# Patient Record
Sex: Female | Born: 1935 | Race: White | Hispanic: No | Smoking: Never smoker
Health system: Southern US, Community
[De-identification: ages and names within clinical notes are randomized; demographics above are authoritative.]

## PROBLEM LIST (undated history)

## (undated) DIAGNOSIS — E039 Hypothyroidism, unspecified: Secondary | ICD-10-CM

## (undated) DIAGNOSIS — F0390 Unspecified dementia without behavioral disturbance: Secondary | ICD-10-CM

## (undated) DIAGNOSIS — I10 Essential (primary) hypertension: Secondary | ICD-10-CM

---

## 2018-11-27 ENCOUNTER — Emergency Department (HOSPITAL_COMMUNITY)
Admission: EM | Admit: 2018-11-27 | Discharge: 2018-11-28 | Disposition: A | Discharge status: Home / Self Care | Payer: Medicare Other | Attending: Emergency Medicine | Admitting: Emergency Medicine

## 2018-11-27 DIAGNOSIS — M545 Low back pain, unspecified: Secondary | ICD-10-CM

## 2018-11-27 DIAGNOSIS — K5900 Constipation, unspecified: Secondary | ICD-10-CM | POA: Diagnosis not present

## 2018-11-27 DIAGNOSIS — R109 Unspecified abdominal pain: Secondary | ICD-10-CM | POA: Diagnosis present

## 2018-11-27 LAB — COMPREHENSIVE METABOLIC PANEL
ALK PHOS: 71 U/L (ref 38–126)
ALT: 17 U/L (ref 0–44)
AST: 19 U/L (ref 15–41)
Albumin: 3.5 g/dL (ref 3.5–5.0)
Anion gap: 6 (ref 5–15)
BUN: 19 mg/dL (ref 8–23)
CALCIUM: 10.1 mg/dL (ref 8.9–10.3)
CO2: 27 mmol/L (ref 22–32)
Chloride: 107 mmol/L (ref 98–111)
Creatinine, Ser: 0.77 mg/dL (ref 0.44–1.00)
GFR calc Af Amer: >60 mL/min (ref >60)
GFR calc non Af Amer: >60 mL/min (ref >60)
Glucose, Bld: 156 mg/dL — ABNORMAL HIGH (ref 70–99)
Potassium: 3.5 mmol/L (ref 3.5–5.1)
Sodium: 140 mmol/L (ref 135–145)
Total Bilirubin: 0.5 mg/dL (ref 0.3–1.2)
Total Protein: 5.5 g/dL — ABNORMAL LOW (ref 6.5–8.1)

## 2018-11-27 LAB — URINALYSIS, ROUTINE W REFLEX MICROSCOPIC
Bilirubin Urine: NEGATIVE
Glucose, UA: NEGATIVE mg/dL
HGB URINE DIPSTICK: NEGATIVE
Ketones, ur: NEGATIVE mg/dL
Leukocytes,Ua: NEGATIVE
Nitrite: NEGATIVE
Protein, ur: NEGATIVE mg/dL
Specific Gravity, Urine: 1.012 (ref 1.005–1.030)
pH: 5 (ref 5.0–8.0)

## 2018-11-27 LAB — CBC
HCT: 39.2 % (ref 36.0–46.0)
Hemoglobin: 12.8 g/dL (ref 12.0–15.0)
MCH: 31.8 pg (ref 26.0–34.0)
MCHC: 32.7 g/dL (ref 30.0–36.0)
MCV: 97.5 fL (ref 80.0–100.0)
Platelets: 263 10*3/uL (ref 150–400)
RBC: 4.02 MIL/uL (ref 3.87–5.11)
RDW: 12.7 % (ref 11.5–15.5)
WBC: 8.1 10*3/uL (ref 4.0–10.5)
nRBC: 0 % (ref 0.0–0.2)

## 2018-11-27 MED ORDER — SODIUM CHLORIDE 0.9% FLUSH: 3.0000 mL | Freq: Once | INTRAVENOUS | Status: DC

## 2018-11-27 NOTE — ED Triage Notes (Signed)
Per family pt fell last week, was seen at ED in [place] and had xrays done which were all negative. Was told by doctors she needs to have MRI done if pain persists, but unsure of what kind of MRI needs to be done. Family states pt has generalized pain and is more confused than usual.

## 2018-11-28 ENCOUNTER — Emergency Department (HOSPITAL_COMMUNITY): Payer: Medicare Other

## 2018-11-28 DIAGNOSIS — M545 Low back pain: Secondary | ICD-10-CM | POA: Diagnosis not present

## 2018-11-28 NOTE — Discharge Instructions (Addendum)
You can be discharged home safely tonight. It is recommended that you use Miralax once daily for the next 3 days. Follow up with your doctor for recheck next week.

## 2018-11-28 NOTE — ED Notes (Signed)
ED Provider at bedside. 

## 2018-11-28 NOTE — ED Provider Notes (Signed)
[facility] EMERGENCY DEPARTMENT Provider Note   CSN: [id number] Arrival date & time: 11/27/18  1959     History   Chief Complaint Chief Complaint  Patient presents with  . Fall    HPI Adie Hoecker is a 85 y.o. female.  85 yo patient presents with daughter, who is caregiver, for further evaluation of right sided abdominal pain and low back pain since a fall 8 days ago. The patient normally walks with a walker. She got up to go to the bathroom and fell while walking. She was able to get herself up to a chair, went back to bed and was able to get up and down to the bathroom through the night as per her usual nocturia. The next morning she called for her daughter, whom she lives with, and reported low back and RLQ abdominal pain, presumably because of the fall. She denies having hit her head, denies chest pain, headache, SOB, nausea or vomiting. She has continued to eat and drink per her usual. She has chronic issues with constipation and takes medication regularly for this. Last BM was yesterday. No urinary incontinence. Her daughter took her to the hospital in [place] who did an x-ray, discharged home home and mentioned that if the back pain doesn't get better she may need an MRI, which the patient is asking about tonight.  The history is provided by the patient and a caregiver. No language interpreter was used.  Fall  Associated symptoms include abdominal pain.    No past medical history on file.  There are no active problems to display for this patient.    OB History   No obstetric history on file.      Home Medications    Prior to Admission medications   Not on File    Family History No family history on file.  Social History Social History   Tobacco Use  . Smoking status: Not on file  Substance Use Topics  . Alcohol use: Not on file  . Drug use: Not on file     Allergies   Azithromycin   Review of Systems Review of Systems    Constitutional: Negative for chills and fever.  HENT: Negative.   Respiratory: Negative.   Cardiovascular: Negative.   Gastrointestinal: Positive for abdominal pain.  Musculoskeletal: Positive for back pain.  Skin: Negative.   Neurological: Negative.      Physical Exam Updated Vital Signs BP (!) 189/86   Pulse 79   Temp 98.4 F (36.9 C) (Oral)   Resp 16   SpO2 95%   Physical Exam Constitutional:      General: She is not in acute distress.    Appearance: She is well-developed. She is not ill-appearing.  HENT:     Head: Normocephalic and atraumatic.     Mouth/Throat:     Mouth: Mucous membranes are moist.  Neck:     Musculoskeletal: Normal range of motion and neck supple.  Cardiovascular:     Rate and Rhythm: Normal rate and regular rhythm.     Heart sounds: No murmur.  Pulmonary:     Effort: Pulmonary effort is normal.     Breath sounds: Normal breath sounds. No wheezing, rhonchi or rales.  Abdominal:     General: Bowel sounds are normal.     Palpations: Abdomen is soft.     Tenderness: There is abdominal tenderness (Mild right abdominal tenderness. There is fullness in the RLQ possibly representing constipation.). There is no guarding  or rebound.  Musculoskeletal: Normal range of motion.        General: No tenderness.     Right lower leg: No edema.     Left lower leg: No edema.     Comments: Back appears atraumatic. She is tender to midline lower back. FROM with full movement in LE's.   Skin:    General: Skin is warm and dry.     Findings: No rash.  Neurological:     Mental Status: She is alert and oriented to person, place, and time.     Sensory: No sensory deficit.     Coordination: Coordination normal.      ED Treatments / Results  Labs (all labs ordered are listed, but only abnormal results are displayed) Labs Reviewed  COMPREHENSIVE METABOLIC PANEL - Abnormal; Notable for the following components:      Result Value   Glucose, Bld 156 (*)    Total  Protein 5.5 (*)    All other components within normal limits  URINE CULTURE  CBC  URINALYSIS, ROUTINE W REFLEX MICROSCOPIC    EKG None  Radiology No results found.  Procedures Procedures (including critical care time)  Medications Ordered in ED Medications  sodium chloride flush (NS) 0.9 % injection 3 mL (has no administration in time range)     Initial Impression / Assessment and Plan / ED Course  I have reviewed the triage vital signs and the nursing notes.  Pertinent labs & imaging results that were available during my care of the patient were reviewed by me and considered in my medical decision making (see chart for details).     Patient to ED with complaint of RLQ abdominal and lower back pain since fall over one week ago. Seen in the interim with concern for ?back injury.  The patient is very well appearing. Oriented. Moves all extremities. Is in NAD. VSS, hypertensive (hasn't taken night time meds). Abdomen is soft, mildly tender. Fullness in RLQ with potential for constipation. Do not feel there is a solid mass.   She is evaluated by Dr. Nicanor Alcon who agrees with exam.   Imaging shows moderate stool burden. Normal BGP. Do no suspect obstruction. Lumbar spine shows some curvature but no acute change.   Results shared with patient and daughter. She is appropriate for discharge home and it is recommended she use Miralax once daily for the next 3 days. Follow up with PCP also recommended.   Final Clinical Impressions(s) / ED Diagnoses   Final diagnoses:  None   1. Constipation 2. Low back pain  ED Discharge Orders    None       Danne Harbor 11/28/18 2707    Palumbo, April, MD 11/28/18 0300

## 2018-11-29 LAB — URINE CULTURE: Culture: NO GROWTH

## 2019-04-05 ENCOUNTER — Inpatient Hospital Stay (HOSPITAL_COMMUNITY)
Admission: AD | Admit: 2019-04-05 | Discharge: 2019-04-07 | DRG: 378 | Disposition: A | Discharge status: Home / Self Care | Payer: Medicare Other | Source: Other Acute Inpatient Hospital | Attending: Internal Medicine | Admitting: Internal Medicine

## 2019-04-05 ENCOUNTER — Encounter (HOSPITAL_COMMUNITY): Payer: Self-pay | Admitting: Internal Medicine

## 2019-04-05 DIAGNOSIS — E039 Hypothyroidism, unspecified: Secondary | ICD-10-CM | POA: Diagnosis present

## 2019-04-05 DIAGNOSIS — Z7982 Long term (current) use of aspirin: Secondary | ICD-10-CM | POA: Diagnosis not present

## 2019-04-05 DIAGNOSIS — Z79899 Other long term (current) drug therapy: Secondary | ICD-10-CM

## 2019-04-05 DIAGNOSIS — F0390 Unspecified dementia without behavioral disturbance: Secondary | ICD-10-CM | POA: Diagnosis present

## 2019-04-05 DIAGNOSIS — Z7952 Long term (current) use of systemic steroids: Secondary | ICD-10-CM

## 2019-04-05 DIAGNOSIS — D62 Acute posthemorrhagic anemia: Secondary | ICD-10-CM | POA: Diagnosis present

## 2019-04-05 DIAGNOSIS — Z881 Allergy status to other antibiotic agents status: Secondary | ICD-10-CM

## 2019-04-05 DIAGNOSIS — K922 Gastrointestinal hemorrhage, unspecified: Secondary | ICD-10-CM | POA: Diagnosis present

## 2019-04-05 DIAGNOSIS — Z1159 Encounter for screening for other viral diseases: Secondary | ICD-10-CM

## 2019-04-05 DIAGNOSIS — Z79890 Hormone replacement therapy (postmenopausal): Secondary | ICD-10-CM | POA: Diagnosis not present

## 2019-04-05 DIAGNOSIS — I10 Essential (primary) hypertension: Secondary | ICD-10-CM | POA: Diagnosis present

## 2019-04-05 DIAGNOSIS — K921 Melena: Secondary | ICD-10-CM | POA: Diagnosis present

## 2019-04-05 DIAGNOSIS — K5791 Diverticulosis of intestine, part unspecified, without perforation or abscess with bleeding: Principal | ICD-10-CM | POA: Diagnosis present

## 2019-04-05 HISTORY — DX: Unspecified dementia, unspecified severity, without behavioral disturbance, psychotic disturbance, mood disturbance, and anxiety: F03.90

## 2019-04-05 HISTORY — DX: Essential (primary) hypertension: I10

## 2019-04-05 HISTORY — DX: Hypothyroidism, unspecified: E03.9

## 2019-04-05 LAB — CBC
HCT: 41.2 % (ref 36.0–46.0)
HCT: 41.3 % (ref 36.0–46.0)
HCT: 40.7 % (ref 36.0–46.0)
Hemoglobin: 13.6 g/dL (ref 12.0–15.0)
Hemoglobin: 13.6 g/dL (ref 12.0–15.0)
Hemoglobin: 13.5 g/dL (ref 12.0–15.0)
MCH: 31.9 pg (ref 26.0–34.0)
MCH: 31.9 pg (ref 26.0–34.0)
MCH: 31.9 pg (ref 26.0–34.0)
MCHC: 33 g/dL (ref 30.0–36.0)
MCHC: 32.9 g/dL (ref 30.0–36.0)
MCHC: 33.2 g/dL (ref 30.0–36.0)
MCV: 96.7 fL (ref 80.0–100.0)
MCV: 96.9 fL (ref 80.0–100.0)
MCV: 96.2 fL (ref 80.0–100.0)
Platelets: 246 10*3/uL (ref 150–400)
Platelets: 255 10*3/uL (ref 150–400)
Platelets: 258 10*3/uL (ref 150–400)
RBC: 4.26 MIL/uL (ref 3.87–5.11)
RBC: 4.26 MIL/uL (ref 3.87–5.11)
RBC: 4.23 MIL/uL (ref 3.87–5.11)
RDW: 12.4 % (ref 11.5–15.5)
RDW: 12.4 % (ref 11.5–15.5)
RDW: 12.5 % (ref 11.5–15.5)
WBC: 9.7 10*3/uL (ref 4.0–10.5)
WBC: 8.3 10*3/uL (ref 4.0–10.5)
WBC: 9 10*3/uL (ref 4.0–10.5)
nRBC: 0 % (ref 0.0–0.2)
nRBC: 0 % (ref 0.0–0.2)
nRBC: 0 % (ref 0.0–0.2)

## 2019-04-05 LAB — COMPREHENSIVE METABOLIC PANEL
ALT: 18 U/L (ref 0–44)
AST: 15 U/L (ref 15–41)
Albumin: 3.3 g/dL — ABNORMAL LOW (ref 3.5–5.0)
Alkaline Phosphatase: 79 U/L (ref 38–126)
Anion gap: 11 (ref 5–15)
BUN: 20 mg/dL (ref 8–23)
CO2: 26 mmol/L (ref 22–32)
Calcium: 10.2 mg/dL (ref 8.9–10.3)
Chloride: 102 mmol/L (ref 98–111)
Creatinine, Ser: 0.84 mg/dL (ref 0.44–1.00)
GFR calc Af Amer: >60 mL/min (ref >60)
GFR calc non Af Amer: >60 mL/min (ref >60)
Glucose, Bld: 99 mg/dL (ref 70–99)
Potassium: 3.7 mmol/L (ref 3.5–5.1)
Sodium: 139 mmol/L (ref 135–145)
Total Bilirubin: 0.9 mg/dL (ref 0.3–1.2)
Total Protein: 5.4 g/dL — ABNORMAL LOW (ref 6.5–8.1)

## 2019-04-05 LAB — GLUCOSE, CAPILLARY: Glucose-Capillary: 114 mg/dL — ABNORMAL HIGH (ref 70–99)

## 2019-04-05 LAB — RENAL FUNCTION PANEL
Albumin: 3.3 g/dL — ABNORMAL LOW (ref 3.5–5.0)
Anion gap: 9 (ref 5–15)
BUN: 20 mg/dL (ref 8–23)
CO2: 27 mmol/L (ref 22–32)
Calcium: 10.1 mg/dL (ref 8.9–10.3)
Chloride: 107 mmol/L (ref 98–111)
Creatinine, Ser: 0.82 mg/dL (ref 0.44–1.00)
GFR calc Af Amer: >60 mL/min (ref >60)
GFR calc non Af Amer: >60 mL/min (ref >60)
Glucose, Bld: 116 mg/dL — ABNORMAL HIGH (ref 70–99)
Phosphorus: 2.9 mg/dL (ref 2.5–4.6)
Potassium: 3.8 mmol/L (ref 3.5–5.1)
Sodium: 143 mmol/L (ref 135–145)

## 2019-04-05 LAB — LACTIC ACID, PLASMA: Lactic Acid, Venous: 1.3 mmol/L (ref 0.5–1.9)

## 2019-04-05 LAB — ABO/RH: ABO/RH(D): O POS

## 2019-04-05 LAB — TYPE AND SCREEN
ABO/RH(D): O POS
Antibody Screen: NEGATIVE

## 2019-04-05 LAB — MAGNESIUM: Magnesium: 2.1 mg/dL (ref 1.7–2.4)

## 2019-04-05 LAB — PROTIME-INR
INR: 1 (ref 0.8–1.2)
Prothrombin Time: 12.8 seconds (ref 11.4–15.2)

## 2019-04-05 MED ORDER — LABETALOL HCL 5 MG/ML IV SOLN
10.0000 mg | INTRAVENOUS | Status: DC | PRN
  Administered 2019-04-07 (×2): 10 mg via INTRAVENOUS
  Filled 2019-04-05 (×3): qty 4

## 2019-04-05 MED ORDER — ONDANSETRON HCL 4 MG/2ML IJ SOLN: 4.0000 mg | Freq: Four times a day (QID) | INTRAMUSCULAR | Status: DC | PRN

## 2019-04-05 MED ORDER — ONDANSETRON HCL 4 MG PO TABS: 4.0000 mg | ORAL_TABLET | Freq: Four times a day (QID) | ORAL | Status: DC | PRN

## 2019-04-05 MED ORDER — HYDRALAZINE HCL 20 MG/ML IJ SOLN: 10.0000 mg | INTRAMUSCULAR | Status: DC | PRN

## 2019-04-05 MED ORDER — ACETAMINOPHEN 650 MG RE SUPP: 650.0000 mg | Freq: Four times a day (QID) | RECTAL | Status: DC | PRN

## 2019-04-05 MED ORDER — DEXTROSE-NACL 5-0.9 % IV SOLN
INTRAVENOUS | Status: AC
  Administered 2019-04-05: 20:00:00 via INTRAVENOUS

## 2019-04-05 MED ORDER — PANTOPRAZOLE SODIUM 40 MG IV SOLR
40.0000 mg | Freq: Two times a day (BID) | INTRAVENOUS | Status: DC
  Administered 2019-04-05: 40 mg via INTRAVENOUS
  Filled 2019-04-05: qty 40

## 2019-04-05 MED ORDER — LEVOTHYROXINE SODIUM 100 MCG/5ML IV SOLN
62.5000 ug | Freq: Every day | INTRAVENOUS | Status: DC
  Administered 2019-04-06 – 2019-04-07 (×2): 62.5 ug via INTRAVENOUS
  Filled 2019-04-05 (×2): qty 5

## 2019-04-05 MED ORDER — ACETAMINOPHEN 325 MG PO TABS: 650.0000 mg | ORAL_TABLET | Freq: Four times a day (QID) | ORAL | Status: DC | PRN

## 2019-04-05 MED ORDER — HYDROCORTISONE NA SUCCINATE PF 100 MG IJ SOLR
50.0000 mg | Freq: Once | INTRAMUSCULAR | Status: AC
  Administered 2019-04-05: 08:00:00 50 mg via INTRAVENOUS
  Filled 2019-04-05: qty 2

## 2019-04-05 NOTE — Consult Note (Signed)
Referring Provider:   Dr. Leonia CoronaS. Ogbata Primary Care Physician:  Patient, No Pcp Per Primary Gastroenterologist:  None (unassigned--previously colonosc[place] in [place])  Reason for Consultation:  hematochezia  HPI: Catherine Hess is a 85 y.o. female admitted to the hospital yesterday in transfer from [place], [place], where she resides, but where no GI coverage was available, because of a 1 day history of 5 episodes of moderate volume hematochezia, characterized by burgundy stools without abdominal pain or syncope.    The patient is on a daily aspirin but her BUN was normal in the emergency room.    In 2016, the patient had a similar episode and was treated in [place], and according to the patient's daughter, Catherine BraunKaren, she underwent colonoscopy at that time which showed diverticulosis but apparently no other significant abnormalities.  Since 2016, the patient did not have any recurrent bleeding until yesterday.    She has a tendency toward constipation so she uses prune juice and MiraLAX on a regular basis and it sounds as though there has not been any issue with constipation recently.  Since admission, the patient has had no further bowel movements or bleeding, and her hemoglobin has been holding steady at 13.6.   Past Medical History:  Diagnosis Date  . Dementia (HCC)   . Hypertension   . Hypothyroidism     History reviewed. No pertinent surgical history.  Prior to Admission medications   Medication Sig Start Date End Date Taking? Authorizing Provider  acetaminophen (TYLENOL) 650 MG CR tablet Take 650 mg by mouth 2 (two) times a day.    Yes [provider]  Ascorbic Acid (VITAMIN C) 1000 MG tablet Take 1,000 mg by mouth daily.   Yes [provider]  aspirin EC 81 MG tablet Take 81 mg by mouth daily.   Yes [provider]  calcium carbonate (CALCIUM 600) 600 MG TABS tablet Take 600 mg by mouth daily with breakfast.   Yes [provider]   Carboxymethylcellulose Sodium (REFRESH TEARS OP) Place 1 drop into both eyes 4 (four) times daily.   Yes [provider]  Ginkgo Biloba 60 MG TABS Take 60 mg by mouth daily.   Yes [provider]  levothyroxine (SYNTHROID) 125 MCG tablet Take 125 mcg by mouth daily before breakfast.  10/21/18  Yes [provider]  loratadine (CLARITIN) 10 MG tablet Take 10 mg by mouth daily.   Yes [provider]  losartan (COZAAR) 50 MG tablet Take 25 mg by mouth daily.  10/21/18  Yes [provider]  metoprolol tartrate (LOPRESSOR) 25 MG tablet Take 12.5 mg by mouth 2 (two) times daily.  10/21/18  Yes [provider]  Multiple Vitamins-Minerals (CENTRUM SILVER ADULT 50+ PO) Take 1 tablet by mouth daily.   Yes [provider]  predniSONE (DELTASONE) 5 MG tablet Take 5 mg by mouth daily with breakfast.   Yes [provider]  simvastatin (ZOCOR) 20 MG tablet Take 20 mg by mouth every evening. 10/21/18  Yes [provider]    Current Facility-Administered Medications  Medication Dose Route Frequency Provider Last Rate Last Dose  . acetaminophen (TYLENOL) tablet 650 mg  650 mg Oral Q6H PRN Eduard ClosKakrakandy, Arshad N, MD       Or  . acetaminophen (TYLENOL) suppository 650 mg  650 mg Rectal Q6H PRN Eduard ClosKakrakandy, Arshad N, MD      . dextrose 5 %-0.9 % sodium chloride infusion   Intravenous Continuous Eduard ClosKakrakandy, Arshad N, MD      .  labetalol (NORMODYNE) injection 10 mg  10 mg Intravenous Q2H PRN Eduard ClosKakrakandy, Arshad N, MD      . Melene Muller[START ON 04/06/2019] levothyroxine (SYNTHROID, LEVOTHROID) injection 62.5 mcg  62.5 mcg Intravenous Daily Eduard ClosKakrakandy, Arshad N, MD      . ondansetron [facility](ZOFRAN) tablet 4 mg  4 mg Oral Q6H PRN Eduard ClosKakrakandy, Arshad N, MD       Or  . ondansetron [facility](ZOFRAN) injection 4 mg  4 mg Intravenous Q6H PRN Eduard ClosKakrakandy, Arshad N, MD        Allergies as of 04/04/2019 - Review Complete 11/27/2018  Allergen Reaction Noted  . Azithromycin Swelling  11/27/2018    Family History  Family history unknown: Yes    Social History   Socioeconomic History  . Marital status: Widowed    Spouse name: Not on file  . Number of children: Not on file  . Years of education: Not on file  . Highest education level: Not on file  Occupational History  . Not on file  Social Needs  . Financial resource strain: Not on file  . Food insecurity    Worry: Not on file    Inability: Not on file  . Transportation needs    Medical: Not on file    Non-medical: Not on file  Tobacco Use  . Smoking status: Never Smoker  . Smokeless tobacco: Never Used  Substance and Sexual Activity  . Alcohol use: Not on file  . Drug use: Not on file  . Sexual activity: Not on file  Lifestyle  . Physical activity    Days per week: Not on file    Minutes per session: Not on file  . Stress: Not on file  Relationships  . Social Musicianconnections    Talks on phone: Not on file    Gets together: Not on file    Attends religious service: Not on file    Active member of club or organization: Not on file    Attends meetings of clubs or organizations: Not on file    Relationship status: Not on file  . Intimate partner violence    Fear of current or ex partner: Not on file    Emotionally abused: Not on file    Physically abused: Not on file    Forced sexual activity: Not on file  Other Topics Concern  . Not on file  Social History Narrative  . Not on file    Review of Systems: The patient resides with her daughter  Physical Exam: Vital signs in last 24 hours: Temp:  [97.9 F (36.6 C)-98.6 F (37 C)] 97.9 F (36.6 C) (06/20 0810) Pulse Rate:  [68-81] 81 (06/20 1506) Resp:  [16-18] 18 (06/20 1506) BP: (143-177)/(69-73) 143/73 (06/20 1506) SpO2:  [95 %-98 %] 96 % (06/20 1506) Last BM Date: 04/04/19  A chipper, but somewhat frail and elderly appearing Caucasian female in no distress, good spirits.  Daughter is at the bedside.  The patient has no overt pallor or  icterus, chest is clear, heart normal, abdomen without tenderness, no focal neurologic deficits, grossly cognitively intact, no peripheral edema, strong radial pulse, skin warm and dry.  Intake/Output from previous day: No intake/output data recorded. Intake/Output this shift: No intake/output data recorded.  Lab Results: Recent Labs    04/05/19 0653 04/05/19 1011 04/05/19 1256  WBC 9.7 8.3 9.0  HGB 13.6 13.6 13.5  HCT 41.2 41.3 40.7  PLT 246 255 258   BMET Recent Labs    04/05/19 0653 04/05/19  1011  NA 139 143  K 3.7 3.8  CL 102 107  CO2 26 27  GLUCOSE 99 116*  BUN 20 20  CREATININE 0.84 0.82  CALCIUM 10.2 10.1   LFT Recent Labs    04/05/19 0653 04/05/19 1011  PROT 5.4*  --   ALBUMIN 3.3* 3.3*  AST 15  --   ALT 18  --   ALKPHOS 79  --   BILITOT 0.9  --    PT/INR Recent Labs    04/05/19 1011  LABPROT 12.8  INR 1.0    Studies/Results: No results found.  Impression: Transient moderate-volume hematochezia without drop in hemoglobin, suggestive of a mild, self-limited distal colonic diverticular bleed.  Other possibilities could be hemorrhoidal bleeding or stercoral ulceration, but a photograph which the patient's daughter shows me shows burgundy stool, rather than brown stool admixed with burgundy blood, which to me suggests a more proximal source than the rectal outlet or distal rectum.  Plan: 1.  The patient is hungry and has not had further bleeding, so I have ordered a diet for her. 2.  Recheck CBC tomorrow morning 3.  Given the patient's distance from home and her advanced age, I would tend to be somewhat conservative in terms of how long I keep her in the hospital under observation, even though this appears to have been a very minor bleeding episode.  In other words, I might not send her home tomorrow, even if her hemoglobin remains stable.  That would be partly up to the patient and her daughter. 4.  I do not think that sigmoidoscopic evaluation is  necessary unless the patient has ongoing or persistent bleeding and there is substantial diagnostic uncertainty.  For example, a stercoral ulceration or even a bleeding hemorrhoid would be quite amenable to conservative therapy, but I doubt either of those is present in this patient.   LOS: 0 days   Youlanda Mighty Tsering Leaman  04/05/2019, 6:13 PM   Pager [phone] If no answer or after 5 PM call [phone]

## 2019-04-05 NOTE — Progress Notes (Signed)
This nurse talk to [employer] Systems developer). She was told what could be expect, about Labs and fluids the patient is getting and also patient is not able to eat right now.  Catherine Hess request that a doctor or nurse call her to keep her update. This will passed in report.

## 2019-04-05 NOTE — Plan of Care (Signed)
?  Problem: Clinical Measurements: ?Goal: Ability to maintain clinical measurements within normal limits will improve ?Outcome: Progressing ?Goal: Will remain free from infection ?Outcome: Progressing ?Goal: Diagnostic test results will improve ?Outcome: Progressing ?  ?

## 2019-04-05 NOTE — Progress Notes (Signed)
Admitted earlier today with rectal bleed As per H&P done earlier today by Dr. Hal Hope "Catherine Hess is a 85 y.o. female with history of hypertension, hypothyroidism, possible dementia and on prednisone use per the medical chart was brought to the ER at [facility] for having passed 5 times bloody bowel movement with clots.  Patient denies any abdominal pain nausea vomiting.  Labs done at [facility] show hemoglobin of 14 creatinine was normal mild leukocytosis and CT abdomen pelvis was not showing anything acute.  Given that patient had 5 bowel movements with some clots and since [facility] does not have a gastroenterologist patient was transferred to [facility].  On my exam patient is alert awake and denies any abdominal pain abdomen appears benign.  Follows commands and moves all extremities but does not recall the exact reason sees in the hospital.  I have reviewed patient's charts and labs.  ED Course: Patient is a direct admit".  Patient seen.  Also discussed with the patient's nurse.  No further rectal bleeding reported.  Hemoglobin done earlier was 13.6 g/dL.  Suspect this is likely diverticular bleed.  Will consult GI team.  According to the patient, she has never had a colonoscopy.  Continue to monitor H/H for now.  Management depend on hospital course.  On exam: Patient is not in any distress.  Patient is awake and alert. HEENT: No pallor.  No jaundice. Neck: Supple.  No JVD. Lungs: Clear to auscultation. CVS: S1-S2. Abdomen: Soft, nontender.  Bowel sounds are present.  Organs are not palpable. Neuro: Nonfocal.  Patient moves all extremities. Extremities: No leg edema.  Assessment: Rectal bleed, suspect diverticular bleed.  Plan: GI consult.  Supportive care for now.  Patient is 85 years old, therefore, the benefit of any invasive procedure will have to be weighed against the risk.  Patient's daughter, Cephas Darby, updated (All questions answered).

## 2019-04-05 NOTE — H&P (Signed)
History and Physical    Catherine Hess GMW:[id number] DOB: 09/04/1925 DOA: 04/05/2019  PCP: Patient, No Pcp Per  Patient coming from: Patient was transferred from [place].  History obtained from transferring physician excepted physician and records.  Unable to reach patient's daughter.  Patient has probable dementia and not able to contribute to the history.  Chief Complaint: Rectal bleeding.  HPI: Catherine Hess is a 85 y.o. female with history of hypertension, hypothyroidism, possible dementia and on prednisone use per the medical chart was brought to the ER [facility] for having passed 5 times bloody bowel movement with clots.  Patient denies any abdominal pain nausea vomiting.  [facility] show hemoglobin of 14 creatinine was normal mild leukocytosis and CT abdomen pelvis was not showing anything acute.  Given that patient had 5 bowel movements with some clots and since [facility] does not have a gastroenterologist patient [facility] .  On my exam patient is alert awake and denies any abdominal pain abdomen appears benign.  Follows commands and moves all extremities but does not recall the exact reason sees in the hospital.  I have reviewed patient's charts and labs.  ED Course: Patient is a direct admit.  Review of Systems: As per HPI, rest all negative.   Past Medical History:  Diagnosis Date   Dementia (HCC)    Hypertension    Hypothyroidism     History reviewed. No pertinent surgical history.   reports that she has never smoked. She has never used smokeless tobacco. No history on file for alcohol and drug.  Allergies  Allergen Reactions   Azithromycin Swelling    Family History  Family history unknown: Yes    Prior to Admission medications   Not on File    Physical Exam: Vitals:   04/05/19 0554  BP: (!) 177/73  Pulse: 68  Resp: 16  Temp: 98.6 F (37 C)  SpO2: 95%      Constitutional: Moderately  built and nourished. Vitals:   04/05/19 0554  BP: (!) 177/73  Pulse: 68  Resp: 16  Temp: 98.6 F (37 C)  SpO2: 95%   Eyes: Anicteric no pallor. ENMT: No discharge from the ears eyes nose or mouth. Neck: No mass felt.  No neck rigidity. Respiratory: No rhonchi or crepitations. Cardiovascular: S1-S2 heard. Abdomen: Soft nontender bowel sounds present. Musculoskeletal: No edema.  No joint effusion. Skin: No rash. Neurologic: Alert awake oriented to her name moves all extremities. Psychiatric: Oriented to her name.   Labs on Admission: I have personally reviewed following labs and imaging studies  CBC: No results for input(s): WBC, NEUTROABS, HGB, HCT, MCV, PLT in the last 168 hours. Basic Metabolic Panel: No results for input(s): NA, K, CL, CO2, GLUCOSE, BUN, CREATININE, CALCIUM, MG, PHOS in the last 168 hours. GFR: CrCl cannot be calculated (Patient's most recent lab result is older than the maximum 21 days allowed.). Liver Function Tests: No results for input(s): AST, ALT, ALKPHOS, BILITOT, PROT, ALBUMIN in the last 168 hours. No results for input(s): LIPASE, AMYLASE in the last 168 hours. No results for input(s): AMMONIA in the last 168 hours. Coagulation Profile: No results for input(s): INR, PROTIME in the last 168 hours. Cardiac Enzymes: No results for input(s): CKTOTAL, CKMB, CKMBINDEX, TROPONINI in the last 168 hours. BNP (last 3 results) No results for input(s): PROBNP in the last 8760 hours. HbA1C: No results for input(s): HGBA1C in the last 72 hours. CBG: No results  for input(s): GLUCAP in the last 168 hours. Lipid Profile: No results for input(s): CHOL, HDL, LDLCALC, TRIG, CHOLHDL, LDLDIRECT in the last 72 hours. Thyroid Function Tests: No results for input(s): TSH, T4TOTAL, FREET4, T3FREE, THYROIDAB in the last 72 hours. Anemia Panel: No results for input(s): VITAMINB12, FOLATE, FERRITIN, TIBC, IRON, RETICCTPCT in the last 72 hours. Urine analysis:      Component Value Date/Time   COLORURINE YELLOW 11/27/2018 2020   APPEARANCEUR CLEAR 11/27/2018 2020   LABSPEC 1.012 11/27/2018 2020   PHURINE 5.0 11/27/2018 2020   GLUCOSEU NEGATIVE 11/27/2018 2020   HGBUR NEGATIVE 11/27/2018 2020   BILIRUBINUR NEGATIVE 11/27/2018 2020   KETONESUR NEGATIVE 11/27/2018 2020   PROTEINUR NEGATIVE 11/27/2018 2020   NITRITE NEGATIVE 11/27/2018 2020   LEUKOCYTESUR NEGATIVE 11/27/2018 2020   Sepsis Labs: @LABRCNTIP (procalcitonin:4,lacticidven:4) )No results found for this or any previous visit (from the past 240 hour(s)).   Radiological Exams on Admission: No results found.   Assessment/Plan Principal Problem:   Acute GI bleeding Active Problems:   Essential hypertension   Hypothyroidism    1. Acute GI bleeding -suspect lower GI.  Patient is hemodynamically stable we will keep patient n.p.o. follow CBC.  CAT scan done at [facility] does not show anything acute.  We will keep patient on Protonix IV also.  Will need to get further history from patient's daughter when available. 2. Hypertension we will keep patient on PRN IV labetalol since patient is n.p.o. 3. Hypothyroidism on Synthroid which will be dosed IV for now until patient can swallow orally. 4. Patient's records show patient is on prednisone 5 mg daily.  Does not know if it is just recently or chronically.  For now I have given 1 dose of hydrocortisone 50 mg IV.  When patient's daughter can be reached we need to readdress this.   DVT prophylaxis: SCDs. Code Status: Full code of now we will need to confirm with patient's daughter. Family Communication: Unable to reach patient's daughter. Disposition Plan: Home. Consults called: None. Admission status: Inpatient.   Rise Patience MD [facility] Pager [phone].  If 7PM-7AM, please contact night-coverage [email or web address] Password [email or web address]  04/05/2019, 6:07 AM

## 2019-04-06 ENCOUNTER — Other Ambulatory Visit: Payer: Self-pay

## 2019-04-06 LAB — NOVEL CORONAVIRUS, NAA (HOSP ORDER, SEND-OUT TO REF LAB; TAT 18-24 HRS): SARS-CoV-2, NAA: NOT DETECTED

## 2019-04-06 LAB — CBC
HCT: 36 % (ref 36.0–46.0)
Hemoglobin: 11.7 g/dL — ABNORMAL LOW (ref 12.0–15.0)
MCH: 31.5 pg (ref 26.0–34.0)
MCHC: 32.5 g/dL (ref 30.0–36.0)
MCV: 97 fL (ref 80.0–100.0)
Platelets: 238 10*3/uL (ref 150–400)
RBC: 3.71 MIL/uL — ABNORMAL LOW (ref 3.87–5.11)
RDW: 12.4 % (ref 11.5–15.5)
WBC: 8.7 10*3/uL (ref 4.0–10.5)
nRBC: 0 % (ref 0.0–0.2)

## 2019-04-06 LAB — GLUCOSE, CAPILLARY: Glucose-Capillary: 86 mg/dL (ref 70–99)

## 2019-04-06 NOTE — Progress Notes (Signed)
PROGRESS NOTE    Catherine Hess  IOE:[id number] DOB: Dec 08, 1924 DOA: 04/05/2019 PCP: Patient, No Pcp Per  Outpatient Specialists:     Brief Narrative:  Admitted earlier today with rectal bleed As per H&P done earlier today by Dr. Hal Hope "Marylynn Pearson Phelpsis a 85 y.o.femalewithhistory of hypertension, hypothyroidism, possible dementia and on prednisone use per the medical chart was brought to the ER at [facility] for having passed 5 times bloody bowel movement with clots. Patient denies any abdominal pain nausea vomiting. Labs done at [facility] show hemoglobin of 14 creatinine was normal mild leukocytosis and CT abdomen pelvis was not showing anything acute. Given that patient had 5 bowel movements with some clots and since [facility] does not have a gastroenterologist patient was transferred to [facility]. On my exam patient is alert awake and denies any abdominal pain abdomen appears benign. Follows commands and moves all extremities but does not recall the exact reason sees in the hospital. I have reviewed patient's charts and labs.  ED Course:Patient is a direct admit".  Patient seen.  No further bleeding since admission.  Patient suspected to have diverticular bleed.  Apparently, as per patient's daughter, patient underwent colonoscopy in 2016 and was found to have diverticulosis.  Hemoglobin is down from 13.6 g/dL to 11.7 g/dL.  GI input is appreciated.  We will continue to monitor patient for now.   Assessment & Plan:   Principal Problem:   Acute GI bleeding Active Problems:   Essential hypertension   Hypothyroidism   Rectal bleed, suspect diverticular bleed:  Hemoglobin is down from 13.6 g/dL to 11.7 g/dL.   No further bleeding since admission.  Patient has not had bowel movement as well.   Continue to monitor H/H.   GI input is appreciated.   Continue supportive care.   CAT scan done at [facility] did not reveal any acute findings.     Hypertension: Stable.  Continue to monitor.    Hypothyroidism: Continue Synthroid.  DVT prophylaxis: SCDs. Code Status: Full code. Family Communication: Daughter. Disposition Plan: Home. Consults called: GI.  Procedures:   None  Antimicrobials:   None   Subjective: No further bleeding No shortness of breath No fever or chills Chest pain  Objective: Vitals:   04/05/19 0810 04/05/19 1506 04/05/19 2121 04/06/19 0500  BP: (!) 143/69 (!) 143/73 (!) 145/60 (!) 142/62  Pulse: 73 81 79 80  Resp:  18 18 14   Temp: 97.9 F (36.6 C)  98 F (36.7 C) 98.4 F (36.9 C)  TempSrc: Oral  Oral Axillary  SpO2: 98% 96% 96% 98%  Weight:    58.9 kg    Intake/Output Summary (Last 24 hours) at 04/06/2019 1121 Last data filed at 04/06/2019 1829 Gross per 24 hour  Intake 1067.94 ml  Output 400 ml  Net 667.94 ml   Filed Weights   04/06/19 0500  Weight: 58.9 kg    Examination:  General exam: Appears calm and comfortable  Respiratory system: Clear to auscultation.  Cardiovascular system: S1 & S2  Gastrointestinal system: Abdomen is nondistended, soft and nontender. No organomegaly or masses felt. Normal bowel sounds heard. Central nervous system: Alert and oriented. No focal neurological deficits. Extremities: No leg edema  Data Reviewed: I have personally reviewed following labs and imaging studies  CBC: Recent Labs  Lab 04/05/19 0653 04/05/19 1011 04/05/19 1256 04/06/19 0259  WBC 9.7 8.3 9.0 8.7  HGB 13.6 13.6 13.5 11.7*  HCT 41.2 41.3 40.7 36.0  MCV 96.7  96.9 96.2 97.0  PLT 246 255 258 238   Basic Metabolic Panel: Recent Labs  Lab 04/05/19 0653 04/05/19 1011  NA 139 143  K 3.7 3.8  CL 102 107  CO2 26 27  GLUCOSE 99 116*  BUN 20 20  CREATININE 0.84 0.82  CALCIUM 10.2 10.1  MG  --  2.1  PHOS  --  2.9   GFR: CrCl cannot be calculated (Unknown ideal weight.). Liver Function Tests: Recent Labs  Lab 04/05/19 0653 04/05/19 1011  AST 15  --   ALT 18   --   ALKPHOS 79  --   BILITOT 0.9  --   PROT 5.4*  --   ALBUMIN 3.3* 3.3*   No results for input(s): LIPASE, AMYLASE in the last 168 hours. No results for input(s): AMMONIA in the last 168 hours. Coagulation Profile: Recent Labs  Lab 04/05/19 1011  INR 1.0   Cardiac Enzymes: No results for input(s): CKTOTAL, CKMB, CKMBINDEX, TROPONINI in the last 168 hours. BNP (last 3 results) No results for input(s): PROBNP in the last 8760 hours. HbA1C: No results for input(s): HGBA1C in the last 72 hours. CBG: Recent Labs  Lab 04/05/19 2347 04/06/19 0531  GLUCAP 114* 86   Lipid Profile: No results for input(s): CHOL, HDL, LDLCALC, TRIG, CHOLHDL, LDLDIRECT in the last 72 hours. Thyroid Function Tests: No results for input(s): TSH, T4TOTAL, FREET4, T3FREE, THYROIDAB in the last 72 hours. Anemia Panel: No results for input(s): VITAMINB12, FOLATE, FERRITIN, TIBC, IRON, RETICCTPCT in the last 72 hours. Urine analysis:    Component Value Date/Time   COLORURINE YELLOW 11/27/2018 2020   APPEARANCEUR CLEAR 11/27/2018 2020   LABSPEC 1.012 11/27/2018 2020   PHURINE 5.0 11/27/2018 2020   GLUCOSEU NEGATIVE 11/27/2018 2020   HGBUR NEGATIVE 11/27/2018 2020   BILIRUBINUR NEGATIVE 11/27/2018 2020   KETONESUR NEGATIVE 11/27/2018 2020   PROTEINUR NEGATIVE 11/27/2018 2020   NITRITE NEGATIVE 11/27/2018 2020   LEUKOCYTESUR NEGATIVE 11/27/2018 2020   Sepsis Labs: @LABRCNTIP (procalcitonin:4,lacticidven:4)  )No results found for this or any previous visit (from the past 240 hour(s)).       Radiology Studies: No results found.      Scheduled Meds: . levothyroxine  62.5 mcg Intravenous Daily   Continuous Infusions:   LOS: 1 day    Time spent: 35 minutes    Berton MountSylvester Ogbata, MD  [facility] Pager #: [phone] 7PM-7AM contact night coverage as above

## 2019-04-06 NOTE — Progress Notes (Signed)
Mild overnight drop in hemoglobin, as might be expected from IV hydration, with hemoglobin dropping from 13.5 to a current level of 11.7.  However, per communication with nursing tech, no bleeding.  Impression: Quiescent recent (probable) mild diverticular bleed  Plan: I would recommend watching the patient 1 more day in the hospital in view of her age and distance from home.  Discharge tomorrow would be reasonable from the GI tract standpoint, assuming her hemoglobin remains fairly stable and there is no evidence of recurrent hematochezia.  We will sign off at this time, but please feel free to call us if you have questions or if we can be of further assistance in this patient's care.  Cleotis Nipper, M.D. Pager [phone] If no answer or after 5 PM call [phone]

## 2019-04-07 LAB — GLUCOSE, CAPILLARY
Glucose-Capillary: 102 mg/dL — ABNORMAL HIGH (ref 70–99)
Glucose-Capillary: 106 mg/dL — ABNORMAL HIGH (ref 70–99)
Glucose-Capillary: 83 mg/dL (ref 70–99)

## 2019-04-07 LAB — CBC
HCT: 37.9 % (ref 36.0–46.0)
Hemoglobin: 12.4 g/dL (ref 12.0–15.0)
MCH: 31.9 pg (ref 26.0–34.0)
MCHC: 32.7 g/dL (ref 30.0–36.0)
MCV: 97.4 fL (ref 80.0–100.0)
Platelets: 221 10*3/uL (ref 150–400)
RBC: 3.89 MIL/uL (ref 3.87–5.11)
RDW: 12.4 % (ref 11.5–15.5)
WBC: 8.1 10*3/uL (ref 4.0–10.5)
nRBC: 0 % (ref 0.0–0.2)

## 2019-04-07 MED ORDER — LOSARTAN POTASSIUM 50 MG PO TABS
25.0000 mg | ORAL_TABLET | Freq: Every day | ORAL | Status: DC
  Administered 2019-04-07: 25 mg via ORAL
  Filled 2019-04-07: qty 1

## 2019-04-07 MED ORDER — CARVEDILOL 3.125 MG PO TABS
3.1250 mg | ORAL_TABLET | Freq: Two times a day (BID) | ORAL | Status: DC
  Administered 2019-04-07: 3.125 mg via ORAL
  Filled 2019-04-07: qty 1

## 2019-04-07 NOTE — Evaluation (Signed)
Physical Therapy Evaluation Patient Details Name: Catherine Hess MRN: [id number] DOB: 10/14/1925 Today's Date: 04/07/2019   History of Present Illness  Pt is a 85 y/o female admitted secondary to GI bleed. PMH includes HTN and possible dementia.   Clinical Impression  Pt admitted secondary to problem above with deficits below. Pt reporting increased weakness and only wanting to ambulate within the room. Required min guard to min A for mobility tasks using RW. Reports daughter and grandchildren help with mobility at home. Feel pt would benefit from HHPT to address current deficits. Will continue to follow acutely to maximize functional mobility independence and safety.     Follow Up Recommendations Home health PT;Supervision/Assistance - 24 hour    Equipment Recommendations  None recommended by PT    Recommendations for Other Services       Precautions / Restrictions Precautions Precautions: Fall Restrictions Weight Bearing Restrictions: No      Mobility  Bed Mobility Overal bed mobility: Needs Assistance Bed Mobility: Supine to Sit;Sit to Supine     Supine to sit: Supervision Sit to supine: Supervision   General bed mobility comments: Supervision for safety.   Transfers Overall transfer level: Needs assistance Equipment used: Rolling walker (2 wheeled) Transfers: Sit to/from Stand Sit to Stand: Min assist         General transfer comment: Min A for lift assist and steadying. Cues for safe hand placement.   Ambulation/Gait Ambulation/Gait assistance: Min guard Gait Distance (Feet): 20 Feet Assistive device: Rolling walker (2 wheeled) Gait Pattern/deviations: Step-through pattern;Decreased stride length Gait velocity: Decreased   General Gait Details: Pt reports feeling weak and wanting to stay within the room. Min guard for safety during gait. Cues for sequencing using RW.   Stairs            Wheelchair Mobility    Modified Rankin (Stroke Patients  Only)       Balance Overall balance assessment: Needs assistance Sitting-balance support: Feet supported;No upper extremity supported Sitting balance-Leahy Scale: Good     Standing balance support: Bilateral upper extremity supported;During functional activity Standing balance-Leahy Scale: Poor Standing balance comment: Reliant on BUE support                             Pertinent Vitals/Pain Pain Assessment: No/denies pain    Home Living Family/patient expects to be discharged to:: Private residence Living Arrangements: Children Available Help at Discharge: Family Type of Home: House Home Access: Stairs to enter Entrance Stairs-Rails: None Entrance Stairs-Number of Steps: 3 Home Layout: One level Home Equipment: Environmental consultantWalker - 4 wheels;Walker - 2 wheels      Prior Function Level of Independence: Needs assistance   Gait / Transfers Assistance Needed: Pt reports using rollator and that family assists with mobility. Reports she requires assist with stair management           Hand Dominance        Extremity/Trunk Assessment   Upper Extremity Assessment Upper Extremity Assessment: Defer to OT evaluation    Lower Extremity Assessment Lower Extremity Assessment: Generalized weakness    Cervical / Trunk Assessment Cervical / Trunk Assessment: Normal  Communication   Communication: No difficulties  Cognition Arousal/Alertness: Awake/alert Behavior During Therapy: WFL for tasks assessed/performed Overall Cognitive Status: No family/caregiver present to determine baseline cognitive functioning  General Comments: Pt with some confusion noted. Perseverated on making sure her hospital bill was correct.       General Comments      Exercises     Assessment/Plan    PT Assessment Patient needs continued PT services  PT Problem List Decreased strength;Decreased balance;Decreased activity tolerance;Decreased  mobility;Decreased knowledge of use of DME;Decreased knowledge of precautions;Decreased cognition;Decreased safety awareness       PT Treatment Interventions Gait training;DME instruction;Stair training;Functional mobility training;Therapeutic activities;Therapeutic exercise;Balance training;Patient/family education    PT Goals (Current goals can be found in the Care Plan section)  Acute Rehab PT Goals Patient Stated Goal: to go home today PT Goal Formulation: With patient Time For Goal Achievement: 04/21/19 Potential to Achieve Goals: Good    Frequency Min 3X/week   Barriers to discharge        Co-evaluation               AM-PAC PT "6 Clicks" Mobility  Outcome Measure Help needed turning from your back to your side while in a flat bed without using bedrails?: None Help needed moving from lying on your back to sitting on the side of a flat bed without using bedrails?: A Little Help needed moving to and from a bed to a chair (including a wheelchair)?: A Little Help needed standing up from a chair using your arms (e.g., wheelchair or bedside chair)?: A Little Help needed to walk in hospital room?: A Little Help needed climbing 3-5 steps with a railing? : A Lot 6 Click Score: 18    End of Session Equipment Utilized During Treatment: Gait belt Activity Tolerance: Patient tolerated treatment well Patient left: in bed;with call bell/phone within reach;with bed alarm set Nurse Communication: Mobility status PT Visit Diagnosis: Muscle weakness (generalized) (M62.81);Unsteadiness on feet (R26.81)    Time: 6644-0347 PT Time Calculation (min) (ACUTE ONLY): 24 min   Charges:   PT Evaluation $PT Eval Low Complexity: 1 Low PT Treatments $Gait Training: 8-22 mins        Catherine Hess, PT, DPT  Acute Rehabilitation Services  Pager: [phone] Office: [phone]   Catherine Hess 04/07/2019, 1:24 PM

## 2019-04-07 NOTE — Progress Notes (Signed)
Pt walks with a walker and is assisted for ADL and IADL by her family at home. She presents with generalized weakness and impaired balance with memory deficits which are likely baseline. Pt to return home with family later today. Agree with plan for [facility] therapy.   04/07/19 1500  OT Visit Information  Last OT Received On 04/07/19  Assistance Needed +1  History of Present Illness Pt is a 85 y/o female admitted secondary to GI bleed. PMH includes HTN and possible dementia.   Precautions  Precautions Fall  Restrictions  Weight Bearing Restrictions No  Home Living  Family/patient expects to be discharged to: Private residence  Living Arrangements Children  Available Help at Discharge Family;Available PRN/intermittently  Type of Home House  Home Access Stairs to enter  Entrance Stairs-Number of Steps 3  Entrance Stairs-Rails None  Home Layout One level  [place] - 4 wheels;Walker - 2 wheels  Prior Function  Level of Independence Needs assistance  Gait / Transfers Assistance Needed Pt reports using rollator and that family assists with mobility. Reports she requires assist with stair management  ADL's / [place] assisted for meals, housekeeping and showering  Communication  Communication HOH  Pain Assessment  Pain Assessment No/denies pain  Cognition  Arousal/Alertness Awake/alert  Behavior During Therapy WFL for tasks assessed/performed  Overall Cognitive Status No family/caregiver present to determine baseline cognitive functioning  General Comments memory deficits likely baseline  Upper Extremity Assessment  Upper Extremity Assessment Overall WFL for tasks assessed (arthritic changes in hands and L shoulder)  Lower Extremity Assessment  Lower Extremity Assessment Defer to PT evaluation  Cervical / Trunk Assessment  Cervical / Trunk Assessment Kyphotic  ADL  Overall ADL's  Needs assistance/impaired  Eating/Feeding Independent;Sitting  Grooming  Wash/dry hands;Standing;Min guard  Upper Body Bathing Set up;Sitting  Lower Body Bathing Min guard;Sit to/from stand  Upper Body Dressing  Set up;Sitting  Lower Body Dressing Min guard;Sit to/from Environmental education officer guard;Ambulation;Regular Toilet;RW  Toileting- Clothing Manipulation and Hygiene Minimal assistance;Sit to/from stand  Functional mobility during ADLs Min guard;Rolling walker  Vision- History  Baseline Vision/History No visual deficits  Bed Mobility  Overal bed mobility Needs Assistance  Bed Mobility Supine to Sit;Sit to Supine  Supine to sit Supervision  Sit to supine Supervision  General bed mobility comments Supervision for safety.   Transfers  Overall transfer level Needs assistance  Equipment used Rolling walker (2 wheeled)  Transfers Sit to/from Stand  Sit to Stand Min guard;Min assist;From elevated surface  General transfer comment assist to rise  Balance  Overall balance assessment Needs assistance  Sitting balance-Leahy Scale Good  Standing balance-Leahy Scale Poor  Standing balance comment fair for static standing while donning pants  OT - End of Session  Equipment Utilized During Treatment Gait belt;Rolling walker  Activity Tolerance Patient tolerated treatment well  Patient left in bed;with call bell/phone within reach;with bed alarm set  OT Assessment  OT Recommendation/Assessment Patient does not need any further OT services  OT Visit Diagnosis Unsteadiness on feet (R26.81);Other abnormalities of gait and mobility (R26.89)  AM-PAC OT "6 Clicks" Daily Activity Outcome Measure (Version 2)  Help from another person eating meals? 4  Help from another person taking care of personal grooming? 3  Help from another person toileting, which includes using toliet, bedpan, or urinal? 3  Help from another person bathing (including washing, rinsing, drying)? 3  Help from another person to put on and taking off regular upper body  clothing? 4  Help from  another person to put on and taking off regular lower body clothing? 3  6 Click Score 20  OT Recommendation  Follow Up Recommendations No OT follow up;Supervision/Assistance - 24 hour  OT Equipment None recommended by OT  Acute Rehab OT Goals  Patient Stated Goal to go home today  OT Time Calculation  OT Start Time (ACUTE ONLY) 1500  OT Stop Time (ACUTE ONLY) 1536  OT Time Calculation (min) 36 min  OT General Charges  $OT Visit 1 Visit  OT Evaluation  $OT Eval Moderate Complexity 1 Mod  OT Treatments  $Self Care/Home Management  8-22 mins  Written Expression  Dominant Hand Right  Martie RoundJulie Ardyce Heyer, OTR/L Acute Rehabilitation Services Pager: [phone] Office: [phone]

## 2019-04-07 NOTE — TOC Transition Note (Signed)
Transition of Care [facility]) - CM/SW Discharge Note   Patient Details  Name: Catherine Hess MRN: [id number] Date of Birth: 01-13-1925  [facility] CM/SW Contact:  Sharin Mons, RN Phone Number: 04/07/2019, 3:07 PM   Clinical Narrative:    Admitted with GI bleed. Transition to home with daughter. Referral with [facility] made after choice provided to pt/daughter. Acceptance pending.  Daughter to provide transportation to home.    Cephas Darby (Daughter)     [phone]      PCP: Olga Millers  Final next level of care: Home w Home Health Services Barriers to Discharge: No Barriers Identified   Patient Goals and CMS Choice Patient states their goals for this hospitalization and ongoing recovery are:: to go home with family CMS Medicare.gov Compare Post Acute Care list provided to:: Patient Choice offered to / list presented to : Patient  Discharge Placement                       Discharge Plan and Services                DME Arranged: N/A DME Agency: NA       HH Arranged: PT [place] Agency: [place] Date [place]: 04/07/19 Time [place]: 1007 Representative spoke with at [place]: [place] ([place]) Interventions     Readmission Risk Interventions No flowsheet data found.

## 2019-04-07 NOTE — Discharge Summary (Signed)
Physician Discharge Summary  Patient ID: Catherine Hess MRN: [id number] DOB/AGE: 83/12/1924 83 y.o.  Admit date: 04/05/2019 Discharge date: 04/07/2019  Admission Diagnoses:  Discharge Diagnoses:  Principal Problem:   Acute GI bleeding, suspect diverticular bleed. Active Problems:   Essential hypertension   Hypothyroidism   Acute blood loss anemia.  Discharged Condition: stable  Hospital Course: Patient is a 85 year old female with past medical history significant for diverticulosis (as per colonoscopy done about 2 years ago came as reported by patient's daughter.  I have not visualized the colonoscopy result), hypertension, hypothyroidism and possible dementia.  Patient was [facility] with rectal bleed (bright red blood per rectum).  CT abdomen and pelvis did not reveal any acute findings.  [facility], hence, [facility] and management. Patient was admitted and managed conservatively.  Hemoglobin and hematocrit were monitored during the hospital stay.  No significant drop in hemoglobin was noted.  Hemoglobin dropped from 13.6 g/dL to 01.012.4 g/dL (prior to discharge).  Gastroenterology team was consulted to assist in patient's management.  Patient was seen by Dr. Matthias HughsBuccini, local gastroenterologist.  No further bleeding noted during the hospital stay.  Patient has been cleared for discharge.  Patient will follow with your primary care provider and the gastroenterology team within 1 week of discharge.  Rectal bleed, suspect diverticular bleed:  Hemoglobin is down from 13.6 g/dL to 27.212.4 g/dL.   No further bleeding since admission.   GI input is appreciated.   She will be discharged to the care of the primary care provider and the GI team.  Hypertension: Stable.   Continue to monitor.    Hypothyroidism: Continue Synthroid.  Consults: GI  Significant Diagnostic  Studies: labs: Hemoglobin was monitored during the hospital stay.  Minimal drop in hemoglobin noted.  Treatments: Patient was managed conservatively.  Discharge Exam: Blood pressure (!) 185/62, pulse 65, temperature 97.6 F (36.4 C), temperature source Oral, resp. rate 18, weight 57.5 kg, SpO2 96 %.   Disposition: Discharge disposition: 01-Home or Self Care   Discharge Instructions    Diet - low sodium heart healthy   Complete by: As directed    Increase activity slowly   Complete by: As directed      Allergies as of 04/07/2019      Reactions   Azithromycin Swelling      Medication List    STOP taking these medications   acetaminophen 650 MG CR tablet Commonly known as: TYLENOL   aspirin EC 81 MG tablet   Ginkgo Biloba 60 MG Tabs   predniSONE 5 MG tablet Commonly known as: DELTASONE     TAKE these medications   Calcium 600 600 MG Tabs tablet Generic drug: calcium carbonate Take 600 mg by mouth daily with breakfast.   CENTRUM SILVER ADULT 50+ PO Take 1 tablet by mouth daily.   levothyroxine 125 MCG tablet Commonly known as: SYNTHROID Take 125 mcg by mouth daily before breakfast.   loratadine 10 MG tablet Commonly known as: CLARITIN Take 10 mg by mouth daily.   losartan 50 MG tablet Commonly known as: COZAAR Take 25 mg by mouth daily.   metoprolol tartrate 25 MG tablet Commonly known as: LOPRESSOR Take 12.5 mg by mouth 2 (two) times daily.   REFRESH TEARS OP Place 1 drop into both eyes 4 (four) times daily.   simvastatin 20 MG tablet Commonly known as: ZOCOR Take 20 mg  by mouth every evening.   vitamin C 1000 MG tablet Take 1,000 mg by mouth daily.        SignedBonnell Public 04/07/2019, 11:07 AM

## 2019-04-07 NOTE — Progress Notes (Signed)
Pt discharged via family. Pt currently a/o X 4, showing no signs of distress. MD aware of discharge.  All belongings sent home with patient. RN to continue to monitor.

## 2019-11-21 ENCOUNTER — Other Ambulatory Visit: Payer: Self-pay

## 2019-11-21 ENCOUNTER — Emergency Department (HOSPITAL_COMMUNITY)
Admission: EM | Admit: 2019-11-21 | Discharge: 2019-11-22 | Disposition: A | Discharge status: Home / Self Care | Payer: Medicare Other | Source: Home / Self Care | Attending: Emergency Medicine | Admitting: Emergency Medicine

## 2019-11-21 ENCOUNTER — Emergency Department (HOSPITAL_COMMUNITY): Payer: Medicare Other

## 2019-11-21 DIAGNOSIS — M7989 Other specified soft tissue disorders: Secondary | ICD-10-CM

## 2019-11-21 DIAGNOSIS — I2692 Saddle embolus of pulmonary artery without acute cor pulmonale: Secondary | ICD-10-CM | POA: Diagnosis not present

## 2019-11-21 LAB — BASIC METABOLIC PANEL
Anion gap: 13 (ref 5–15)
BUN: 15 mg/dL (ref 8–23)
CO2: 27 mmol/L (ref 22–32)
Calcium: 10.1 mg/dL (ref 8.9–10.3)
Chloride: 101 mmol/L (ref 98–111)
Creatinine, Ser: 0.8 mg/dL (ref 0.44–1.00)
GFR calc Af Amer: >60 mL/min (ref >60)
GFR calc non Af Amer: >60 mL/min (ref >60)
Glucose, Bld: 132 mg/dL — ABNORMAL HIGH (ref 70–99)
Potassium: 3.9 mmol/L (ref 3.5–5.1)
Sodium: 141 mmol/L (ref 135–145)

## 2019-11-21 LAB — CBC
HCT: 45.5 % (ref 36.0–46.0)
Hemoglobin: 14.7 g/dL (ref 12.0–15.0)
MCH: 31.9 pg (ref 26.0–34.0)
MCHC: 32.3 g/dL (ref 30.0–36.0)
MCV: 98.7 fL (ref 80.0–100.0)
Platelets: 181 10*3/uL (ref 150–400)
RBC: 4.61 MIL/uL (ref 3.87–5.11)
RDW: 13.1 % (ref 11.5–15.5)
WBC: 10.4 10*3/uL (ref 4.0–10.5)
nRBC: 0 % (ref 0.0–0.2)

## 2019-11-21 NOTE — ED Triage Notes (Signed)
Pt sent here to r/o DVT left lower extremity.

## 2019-11-21 NOTE — ED Provider Notes (Signed)
[facility] EMERGENCY DEPARTMENT Provider Note   CSN: [id number] Arrival date & time: 11/21/19  2050     History Chief Complaint  Patient presents with  . Leg Swelling    Catherine Hess is a 86 y.o. female.  Patient to ED with granddaughter with left lower leg swelling that the patient says has been going on for the last one week, but that the granddaughter only became aware of yesterday. She took the patient to see her doctor earlier today who expressed concern for infection vs DVT. No h/o clots. History of HTN, arthritis and mild dementia. The patient is active and takes care of many of her own needs independently. She states the leg is sore, but then reports both legs have been sore. She does not feel she has injured the leg. Granddaughter states she saw a small amount of blood on her bed sheets yesterday in the area where the leg would lay, but has not noticed any open wounds. The patient and granddaughter report a cough that has been present over the last several weeks. No known fever.   The history is provided by the patient. No language interpreter was used.       Past Medical History:  Diagnosis Date  . Dementia ([place])   . Hypertension   . Hypothyroidism     Patient Active Problem List   Diagnosis Date Noted  . Acute GI bleeding 04/05/2019  . Essential hypertension 04/05/2019  . Hypothyroidism 04/05/2019    No past surgical history on file.   OB History   No obstetric history on file.     Family History  Family history unknown: Yes    Social History   Tobacco Use  . Smoking status: Never Smoker  . Smokeless tobacco: Never Used  Substance Use Topics  . Alcohol use: Not on file  . Drug use: Not on file    Home Medications Prior to Admission medications   Medication Sig Start Date End Date Taking? Authorizing Provider  Ascorbic Acid (VITAMIN C) 1000 MG tablet Take 1,000 mg by mouth daily.    [provider]  calcium  carbonate (CALCIUM 600) 600 MG TABS tablet Take 600 mg by mouth daily with breakfast.    [provider]  Carboxymethylcellulose Sodium (REFRESH TEARS OP) Place 1 drop into both eyes 4 (four) times daily.    [provider]  levothyroxine (SYNTHROID) 125 MCG tablet Take 125 mcg by mouth daily before breakfast.  10/21/18   [provider]  loratadine (CLARITIN) 10 MG tablet Take 10 mg by mouth daily.    [provider]  losartan (COZAAR) 50 MG tablet Take 25 mg by mouth daily.  10/21/18   [provider]  metoprolol tartrate (LOPRESSOR) 25 MG tablet Take 12.5 mg by mouth 2 (two) times daily.  10/21/18   [provider]  Multiple Vitamins-Minerals (CENTRUM SILVER ADULT 50+ PO) Take 1 tablet by mouth daily.    [provider]  simvastatin (ZOCOR) 20 MG tablet Take 20 mg by mouth every evening. 10/21/18   [provider]    Allergies    Azithromycin  Review of Systems   Review of Systems  Constitutional: Negative for appetite change, chills and fever.  HENT: Negative.   Respiratory: Positive for cough.   Cardiovascular: Negative.   Gastrointestinal: Negative.  Negative for vomiting.  Musculoskeletal:       See HPI.  Skin: Positive for color change.  Neurological: Negative.  Negative  for weakness.    Physical Exam Updated Vital Signs BP (!) 153/83 (BP Location: Right Arm)   Pulse (!) 102   Temp 98.2 F (36.8 C) (Oral)   Resp 14   SpO2 95%   Physical Exam Vitals and nursing note reviewed.  Constitutional:      Appearance: She is well-developed.  HENT:     Head: Normocephalic.  Cardiovascular:     Rate and Rhythm: Normal rate and regular rhythm.  Pulmonary:     Effort: Pulmonary effort is normal.     Breath sounds: Normal breath sounds.  Abdominal:     General: Bowel sounds are normal.     Palpations: Abdomen is soft.     Tenderness: There is no abdominal tenderness. There is no guarding or rebound.    Musculoskeletal:        General: Normal range of motion.     Cervical back: Normal range of motion and neck supple.     Comments: Left LE mildly erythematous and swelling. No pitting. Anterior and posterior LE tenderness that is mild. Distal and proximal pulses easily palpable.   Skin:    General: Skin is warm and dry.     Findings: No rash.  Neurological:     Mental Status: She is alert.     Cranial Nerves: No cranial nerve deficit.     ED Results / Procedures / Treatments   Labs (all labs ordered are listed, but only abnormal results are displayed) Labs Reviewed  BASIC METABOLIC PANEL - Abnormal; Notable for the following components:      Result Value   Glucose, Bld 132 (*)    All other components within normal limits  CBC  LACTIC ACID, PLASMA  LACTIC ACID, PLASMA    EKG None  Radiology No results found.  Procedures Procedures (including critical care time)  Medications Ordered in ED Medications - No data to display  ED Course  I have reviewed the triage vital signs and the nursing notes.  Pertinent labs & imaging results that were available during my care of the patient were reviewed by me and considered in my medical decision making (see chart for details).    MDM Rules/Calculators/A&P                      Patient to ED for right leg swelling and pain as detailed in the HPI.   She is well appearing, spry 89-yo patient here with granddaughter who is very helpful with history and information. No history of clots. Does have h/o GI bleed. No fever.  The left leg is swollen without obvious focus of infection. No drainage. Minimally warm to touch and mildly tender. DVT vs early infection/cellulitis.   She will return to [facility] for doppler study in the morning. Lovenox was not considered due to h/o GI bleeding. She does not require pain medications.   Discussed with granddaughter that a Rx for Keflex would be provided to take if the doppler was negative for clots.  Whether positive or negative, close follow up with her PCP is recommended for recheck and as needed further management.  Final Clinical Impression(s) / ED Diagnoses Final diagnoses:  None   1. Left leg pain  Rx / DC Orders ED Discharge Orders    None       Elpidio Anis, PA-C 11/22/19 0334    Sabas Sous, MD 11/27/19 [phone]

## 2019-11-22 ENCOUNTER — Encounter (HOSPITAL_COMMUNITY): Payer: Self-pay | Admitting: Emergency Medicine

## 2019-11-22 ENCOUNTER — Ambulatory Visit (HOSPITAL_BASED_OUTPATIENT_CLINIC_OR_DEPARTMENT_OTHER)
Admission: RE | Admit: 2019-11-22 | Discharge: 2019-11-22 | Disposition: A | Discharge status: Home / Self Care | Payer: Medicare Other | Source: Ambulatory Visit

## 2019-11-22 ENCOUNTER — Other Ambulatory Visit: Payer: Self-pay

## 2019-11-22 ENCOUNTER — Inpatient Hospital Stay (HOSPITAL_COMMUNITY)
Admission: EM | Admit: 2019-11-22 | Discharge: 2019-11-25 | DRG: 176 | Disposition: A | Discharge status: Home Health | Payer: Medicare Other | Attending: Student in an Organized Health Care Education/Training Program | Admitting: Student in an Organized Health Care Education/Training Program

## 2019-11-22 ENCOUNTER — Emergency Department (HOSPITAL_COMMUNITY): Payer: Medicare Other

## 2019-11-22 DIAGNOSIS — Z820 Family history of epilepsy and other diseases of the nervous system: Secondary | ICD-10-CM | POA: Diagnosis not present

## 2019-11-22 DIAGNOSIS — M79609 Pain in unspecified limb: Secondary | ICD-10-CM

## 2019-11-22 DIAGNOSIS — Z7952 Long term (current) use of systemic steroids: Secondary | ICD-10-CM

## 2019-11-22 DIAGNOSIS — M7989 Other specified soft tissue disorders: Secondary | ICD-10-CM | POA: Diagnosis present

## 2019-11-22 DIAGNOSIS — Z79899 Other long term (current) drug therapy: Secondary | ICD-10-CM

## 2019-11-22 DIAGNOSIS — Z8249 Family history of ischemic heart disease and other diseases of the circulatory system: Secondary | ICD-10-CM | POA: Diagnosis not present

## 2019-11-22 DIAGNOSIS — Z79890 Hormone replacement therapy (postmenopausal): Secondary | ICD-10-CM | POA: Diagnosis not present

## 2019-11-22 DIAGNOSIS — R0600 Dyspnea, unspecified: Secondary | ICD-10-CM | POA: Diagnosis present

## 2019-11-22 DIAGNOSIS — M1990 Unspecified osteoarthritis, unspecified site: Secondary | ICD-10-CM | POA: Diagnosis present

## 2019-11-22 DIAGNOSIS — I82412 Acute embolism and thrombosis of left femoral vein: Secondary | ICD-10-CM | POA: Diagnosis present

## 2019-11-22 DIAGNOSIS — E039 Hypothyroidism, unspecified: Secondary | ICD-10-CM | POA: Diagnosis present

## 2019-11-22 DIAGNOSIS — R0789 Other chest pain: Secondary | ICD-10-CM | POA: Diagnosis not present

## 2019-11-22 DIAGNOSIS — I82442 Acute embolism and thrombosis of left tibial vein: Secondary | ICD-10-CM | POA: Diagnosis present

## 2019-11-22 DIAGNOSIS — F0390 Unspecified dementia without behavioral disturbance: Secondary | ICD-10-CM | POA: Diagnosis present

## 2019-11-22 DIAGNOSIS — Z800 Family history of malignant neoplasm of digestive organs: Secondary | ICD-10-CM | POA: Diagnosis not present

## 2019-11-22 DIAGNOSIS — K5790 Diverticulosis of intestine, part unspecified, without perforation or abscess without bleeding: Secondary | ICD-10-CM | POA: Diagnosis present

## 2019-11-22 DIAGNOSIS — I82432 Acute embolism and thrombosis of left popliteal vein: Secondary | ICD-10-CM | POA: Diagnosis present

## 2019-11-22 DIAGNOSIS — I10 Essential (primary) hypertension: Secondary | ICD-10-CM | POA: Diagnosis present

## 2019-11-22 DIAGNOSIS — I82452 Acute embolism and thrombosis of left peroneal vein: Secondary | ICD-10-CM | POA: Diagnosis present

## 2019-11-22 DIAGNOSIS — I824Z2 Acute embolism and thrombosis of unspecified deep veins of left distal lower extremity: Secondary | ICD-10-CM | POA: Diagnosis present

## 2019-11-22 DIAGNOSIS — I2692 Saddle embolus of pulmonary artery without acute cor pulmonale: Secondary | ICD-10-CM | POA: Diagnosis present

## 2019-11-22 DIAGNOSIS — Z20822 Contact with and (suspected) exposure to covid-19: Secondary | ICD-10-CM | POA: Diagnosis present

## 2019-11-22 DIAGNOSIS — Z8719 Personal history of other diseases of the digestive system: Secondary | ICD-10-CM

## 2019-11-22 DIAGNOSIS — I82402 Acute embolism and thrombosis of unspecified deep veins of left lower extremity: Secondary | ICD-10-CM

## 2019-11-22 DIAGNOSIS — I119 Hypertensive heart disease without heart failure: Secondary | ICD-10-CM | POA: Diagnosis not present

## 2019-11-22 DIAGNOSIS — Z881 Allergy status to other antibiotic agents status: Secondary | ICD-10-CM

## 2019-11-22 DIAGNOSIS — I82462 Acute embolism and thrombosis of left calf muscular vein: Secondary | ICD-10-CM | POA: Diagnosis not present

## 2019-11-22 LAB — COMPREHENSIVE METABOLIC PANEL
ALT: 22 U/L (ref 0–44)
AST: 22 U/L (ref 15–41)
Albumin: 3.2 g/dL — ABNORMAL LOW (ref 3.5–5.0)
Alkaline Phosphatase: 80 U/L (ref 38–126)
Anion gap: 8 (ref 5–15)
BUN: 13 mg/dL (ref 8–23)
CO2: 23 mmol/L (ref 22–32)
Calcium: 9.6 mg/dL (ref 8.9–10.3)
Chloride: 107 mmol/L (ref 98–111)
Creatinine, Ser: 0.78 mg/dL (ref 0.44–1.00)
GFR calc Af Amer: >60 mL/min (ref >60)
GFR calc non Af Amer: >60 mL/min (ref >60)
Glucose, Bld: 108 mg/dL — ABNORMAL HIGH (ref 70–99)
Potassium: 3.7 mmol/L (ref 3.5–5.1)
Sodium: 138 mmol/L (ref 135–145)
Total Bilirubin: 1.4 mg/dL — ABNORMAL HIGH (ref 0.3–1.2)
Total Protein: 5.3 g/dL — ABNORMAL LOW (ref 6.5–8.1)

## 2019-11-22 LAB — CBC WITH DIFFERENTIAL/PLATELET
Abs Immature Granulocytes: 0.09 10*3/uL — ABNORMAL HIGH (ref 0.00–0.07)
Basophils Absolute: 0 10*3/uL (ref 0.0–0.1)
Basophils Relative: 0 %
Eosinophils Absolute: 0.2 10*3/uL (ref 0.0–0.5)
Eosinophils Relative: 3 %
HCT: 45.2 % (ref 36.0–46.0)
Hemoglobin: 14.8 g/dL (ref 12.0–15.0)
Immature Granulocytes: 1 %
Lymphocytes Relative: 13 %
Lymphs Abs: 1.1 10*3/uL (ref 0.7–4.0)
MCH: 32.3 pg (ref 26.0–34.0)
MCHC: 32.7 g/dL (ref 30.0–36.0)
MCV: 98.7 fL (ref 80.0–100.0)
Monocytes Absolute: 0.7 10*3/uL (ref 0.1–1.0)
Monocytes Relative: 9 %
Neutro Abs: 6.1 10*3/uL (ref 1.7–7.7)
Neutrophils Relative %: 74 %
Platelets: 150 10*3/uL (ref 150–400)
RBC: 4.58 MIL/uL (ref 3.87–5.11)
RDW: 13.2 % (ref 11.5–15.5)
WBC: 8.2 10*3/uL (ref 4.0–10.5)
nRBC: 0 % (ref 0.0–0.2)

## 2019-11-22 LAB — LACTIC ACID, PLASMA: Lactic Acid, Venous: 1.2 mmol/L (ref 0.5–1.9)

## 2019-11-22 LAB — HEPARIN LEVEL (UNFRACTIONATED): Heparin Unfractionated: 0.48 IU/mL (ref 0.30–0.70)

## 2019-11-22 LAB — SARS CORONAVIRUS 2 (TAT 6-24 HRS): SARS Coronavirus 2: NEGATIVE

## 2019-11-22 LAB — PROTIME-INR
INR: 1 (ref 0.8–1.2)
Prothrombin Time: 13.1 seconds (ref 11.4–15.2)

## 2019-11-22 MED ORDER — CALCIUM CARBONATE 1250 (500 CA) MG PO TABS
1.0000 | ORAL_TABLET | Freq: Every day | ORAL | Status: DC
  Administered 2019-11-23 – 2019-11-25 (×3): 500 mg via ORAL
  Filled 2019-11-22 (×3): qty 1

## 2019-11-22 MED ORDER — PREDNISONE 10 MG PO TABS
10.0000 mg | ORAL_TABLET | Freq: Every day | ORAL | Status: DC
  Administered 2019-11-23 – 2019-11-25 (×3): 10 mg via ORAL
  Filled 2019-11-22 (×3): qty 1

## 2019-11-22 MED ORDER — ADULT MULTIVITAMIN W/MINERALS CH
ORAL_TABLET | Freq: Every day | ORAL | Status: DC
  Administered 2019-11-23 – 2019-11-25 (×3): 1 via ORAL
  Filled 2019-11-22 (×3): qty 1

## 2019-11-22 MED ORDER — HYPROMELLOSE (GONIOSCOPIC) 2.5 % OP SOLN
1.0000 [drp] | Freq: Four times a day (QID) | OPHTHALMIC | Status: DC
  Administered 2019-11-22 – 2019-11-24 (×5): 1 [drp] via OPHTHALMIC
  Filled 2019-11-22: qty 15

## 2019-11-22 MED ORDER — CEPHALEXIN 500 MG PO CAPS: 500.0000 mg | ORAL_CAPSULE | Freq: Three times a day (TID) | ORAL | 0 refills | Status: DC

## 2019-11-22 MED ORDER — HEPARIN (PORCINE) 25000 UT/250ML-% IV SOLN
950.0000 [IU]/h | INTRAVENOUS | Status: DC
  Administered 2019-11-22: 950 [IU]/h via INTRAVENOUS
  Filled 2019-11-22 (×2): qty 250

## 2019-11-22 MED ORDER — IOHEXOL 350 MG/ML SOLN
75.0000 mL | Freq: Once | INTRAVENOUS | Status: AC | PRN
  Administered 2019-11-22: 75 mL via INTRAVENOUS

## 2019-11-22 MED ORDER — SENNOSIDES-DOCUSATE SODIUM 8.6-50 MG PO TABS: 1.0000 | ORAL_TABLET | Freq: Every evening | ORAL | Status: DC | PRN

## 2019-11-22 MED ORDER — PROMETHAZINE HCL 25 MG PO TABS: 12.5000 mg | ORAL_TABLET | Freq: Four times a day (QID) | ORAL | Status: DC | PRN

## 2019-11-22 MED ORDER — ACETAMINOPHEN 650 MG RE SUPP: 650.0000 mg | Freq: Four times a day (QID) | RECTAL | Status: DC | PRN

## 2019-11-22 MED ORDER — LEVOTHYROXINE SODIUM 25 MCG PO TABS
125.0000 ug | ORAL_TABLET | Freq: Every day | ORAL | Status: DC
  Administered 2019-11-23 – 2019-11-25 (×3): 125 ug via ORAL
  Filled 2019-11-22 (×3): qty 1

## 2019-11-22 MED ORDER — HEPARIN BOLUS VIA INFUSION
3000.0000 [IU] | Freq: Once | INTRAVENOUS | Status: AC
  Administered 2019-11-22: 3000 [IU] via INTRAVENOUS
  Filled 2019-11-22: qty 3000

## 2019-11-22 MED ORDER — SIMVASTATIN 20 MG PO TABS
20.0000 mg | ORAL_TABLET | Freq: Every evening | ORAL | Status: DC
  Administered 2019-11-23 – 2019-11-25 (×3): 20 mg via ORAL
  Filled 2019-11-22 (×3): qty 1

## 2019-11-22 MED ORDER — ACETAMINOPHEN 325 MG PO TABS: 650.0000 mg | ORAL_TABLET | Freq: Four times a day (QID) | ORAL | Status: DC | PRN

## 2019-11-22 MED ORDER — PANTOPRAZOLE SODIUM 20 MG PO TBEC
20.0000 mg | DELAYED_RELEASE_TABLET | Freq: Every day | ORAL | Status: DC
  Administered 2019-11-23 – 2019-11-25 (×3): 20 mg via ORAL
  Filled 2019-11-22 (×3): qty 1

## 2019-11-22 NOTE — Discharge Instructions (Addendum)
Come for the doppler study at 11:00 am this morning. Directions are provided in your discharge paperwork.   If the DVT study is negative, we recommend starting the antibiotic and taking as directed. Also recommend follow up with your doctor for recheck in 3 days to insure symptoms are improving.   If the DVT study is positive, you will need to see your doctor immediately to determine the best course of treatment as you at risk for GI bleeding and the treatment for DVT is blood thinner medication.

## 2019-11-22 NOTE — ED Notes (Signed)
Patient transported to CT 

## 2019-11-22 NOTE — Progress Notes (Signed)
VASCULAR LAB PRELIMINARY  PRELIMINARY  PRELIMINARY  PRELIMINARY  Left lower extremity venous duplex completed.    Preliminary report:  See CV proc for preliminary results.  Kendricks Reap, RVT 11/22/2019, 11:31 AM

## 2019-11-22 NOTE — ED Notes (Signed)
Pt was placed on purewick to void, removed after for pt comfort

## 2019-11-22 NOTE — ED Provider Notes (Addendum)
[facility] EMERGENCY DEPARTMENT Provider Note   CSN: [id number] Arrival date & time: 11/22/19  1100     History Chief Complaint  Patient presents with  . Leg Pain    DVT positive    Catherine Hess is a 86 y.o. female.  86 y.o female with a PMH of Dementia, HTN, Hypothyroid presents to the ED with a chief complaint of LLE positive DVT. Patient was evaluated in the ED yesterday, sent here today for an ultrasound of her left lower extremity, ultrasound revealed a positive DVT.  According to note yesterday Lovenox had not been an option as patient had a prior history of GI bleed 04/05/2019.  Patient reports swelling to the left leg, there is no pain but does report discomfort along her knee, she does have a history of arthritis which according to the granddaughter has been placed on prednisone 10 mg daily.  According to daughter, patient was previously placed on aspirin daily however has not been taking this.  He also endorses some pain along her neck which she has been using Voltaren gel for.  According to granddaughter patient has had a congested nose for the past few weeks, has been taking therapy for it without improvement.  Patient does not report any shortness of breath, chest pain, other complaints.  The history is provided by the patient and a relative.  Leg Pain Associated symptoms: no back pain and no fever        Past Medical History:  Diagnosis Date  . Dementia ([place] )   . Hypertension   . Hypothyroidism     Patient Active Problem List   Diagnosis Date Noted  . Leg DVT (deep venous thromboembolism), acute, left ([place])   . 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 Acute GI bleeding 04/05/2019  . Essential hypertension 04/05/2019  . Hypothyroidism 04/05/2019    History reviewed. No pertinent surgical history.   OB History   No obstetric history on file.     Family History  Family history unknown: Yes    Social History   Tobacco Use  .  Smoking status: Never Smoker  . Smokeless tobacco: Never Used  Substance Use Topics  . Alcohol use: Not Currently  . Drug use: Never    Home Medications Prior to Admission medications   Medication Sig Start Date End Date Taking? Authorizing Provider  acetaminophen (TYLENOL) 650 MG CR tablet Take 650 mg by mouth every 8 (eight) hours as needed for pain.   Yes [provider]  Ascorbic Acid (VITAMIN C) 1000 MG tablet Take 1,000 mg by mouth daily.   Yes [provider]  Calcium Carb-Cholecalciferol (CALCIUM 600-D PO) Take 1 tablet by mouth at bedtime.   Yes [provider]  Carboxymethylcellulose Sodium (REFRESH TEARS OP) Place 1 drop into both eyes 4 (four) times daily.   Yes [provider]  diclofenac Sodium (VOLTAREN) 1 % GEL Apply 1 application topically daily as needed (pain).   Yes [provider]  ELDERBERRY PO Take 2 tablets by mouth daily.   Yes [provider]  levothyroxine (SYNTHROID) 125 MCG tablet Take 125 mcg by mouth daily before breakfast.  10/21/18  Yes [provider]  loratadine (CLARITIN) 10 MG tablet Take 10 mg by mouth daily.   Yes [provider]  losartan (COZAAR) 50 MG tablet Take 25 mg by mouth daily.  10/21/18  Yes [provider]  metoprolol tartrate (LOPRESSOR) 25 MG tablet Take 12.5 mg by mouth  2 (two) times daily.  10/21/18  Yes [provider]  Multiple Vitamin (MULTIVITAMIN WITH MINERALS) TABS tablet Take 1 tablet by mouth daily.   Yes [provider]  omeprazole (PRILOSEC OTC) 20 MG tablet Take 20 mg by mouth daily.   Yes [provider]  predniSONE (DELTASONE) 10 MG tablet Take 10 mg by mouth daily. 10/22/19  Yes [provider]  simvastatin (ZOCOR) 20 MG tablet Take 20 mg by mouth at bedtime.  10/21/18  Yes [provider]  apixaban (ELIQUIS) 5 MG TABS tablet Take 2 tablets (10 mg total) by mouth 2 (two) times daily for 5 days. 11/25/19 11/30/19   Jeanmarie Hubert, MD  apixaban (ELIQUIS) 5 MG TABS tablet Take 1 tablet (5 mg total) by mouth 2 (two) times daily. 11/30/19   Jeanmarie Hubert, MD  calcium carbonate (CALCIUM 600) 600 MG TABS tablet Take 1 tablet (600 mg total) by mouth daily with breakfast. 11/25/19   Jeanmarie Hubert, MD    Allergies    Azithromycin  Review of Systems   Review of Systems  Constitutional: Negative for fever.  HENT: Positive for rhinorrhea. Negative for sore throat.   Respiratory: Negative for shortness of breath.   Cardiovascular: Positive for leg swelling. Negative for chest pain.  Gastrointestinal: Negative for abdominal pain, nausea and vomiting.  Genitourinary: Negative for flank pain.  Musculoskeletal: Positive for joint swelling. Negative for back pain.  Skin: Negative for pallor and wound.  Neurological: Negative for light-headedness and headaches.  All other systems reviewed and are negative.   Physical Exam Updated Vital Signs BP (!) 164/85   Pulse 74   Temp (!) 97.5 F (36.4 C) (Oral)   Resp 20   Wt 59.2 kg   SpO2 92%   Physical Exam Vitals and nursing note reviewed.  Constitutional:      General: She is not in acute distress.    Appearance: Normal appearance. She is not ill-appearing.  HENT:     Head: Normocephalic and atraumatic.     Mouth/Throat:     Mouth: Mucous membranes are moist.  Eyes:     Pupils: Pupils are equal, round, and reactive to light.  Cardiovascular:     Rate and Rhythm: Normal rate.     Pulses:          Popliteal pulses are 2+ on the right side.       Dorsalis pedis pulses are 2+ on the right side and 2+ on the left side.  Pulmonary:     Effort: Pulmonary effort is normal.     Breath sounds: No wheezing, rhonchi or rales.  Chest:     Chest wall: No tenderness.  Abdominal:     General: Abdomen is flat.     Tenderness: There is no abdominal tenderness. There is no right CVA tenderness, left CVA tenderness or guarding.  Musculoskeletal:         General: Swelling present.     Cervical back: Normal range of motion and neck supple.     Right lower leg: No edema.     Left lower leg: Swelling present. No lacerations, tenderness or bony tenderness. No edema.  Skin:    General: Skin is warm and dry.  Neurological:     Mental Status: She is oriented to person, place, and time.     ED Results / Procedures / Treatments   Labs (all labs ordered are listed, but only abnormal results are displayed) Labs Reviewed  CBC WITH DIFFERENTIAL/PLATELET -  Abnormal; Notable for the following components:      Result Value   Abs Immature Granulocytes 0.09 (*)    All other components within normal limits  COMPREHENSIVE METABOLIC PANEL - Abnormal; Notable for the following components:   Glucose, Bld 108 (*)    Total Protein 5.3 (*)    Albumin 3.2 (*)    Total Bilirubin 1.4 (*)    All other components within normal limits  BASIC METABOLIC PANEL - Abnormal; Notable for the following components:   Glucose, Bld 100 (*)    All other components within normal limits  SARS CORONAVIRUS 2 (TAT 6-24 HRS)  PROTIME-INR  HEPARIN LEVEL (UNFRACTIONATED)  HEPARIN LEVEL (UNFRACTIONATED)  CBC  CBC  CBC    EKG None  Radiology No results found.  Procedures .Critical Care Performed by: Janeece Fitting, PA-C Authorized by: Janeece Fitting, PA-C   Critical care provider statement:    Critical care time (minutes):  45   Critical care start time:  11/22/2019 11:00 AM   Critical care end time:  11/22/2019 11:45 AM   Critical care time was exclusive of:  Separately billable procedures and treating other patients   Critical care was necessary to treat or prevent imminent or life-threatening deterioration of the following conditions:  Circulatory failure   Critical care was time spent personally by me on the following activities:  Blood draw for specimens, development of treatment plan with patient or surrogate, discussions with consultants, evaluation of patient's  response to treatment, examination of patient, obtaining history from patient or surrogate, ordering and performing treatments and interventions, ordering and review of laboratory studies, ordering and review of radiographic studies, pulse oximetry, re-evaluation of patient's condition and review of old charts   (including critical care time)  Medications Ordered in ED Medications  iohexol (OMNIPAQUE) 350 MG/ML injection 75 mL (75 mLs Intravenous Contrast Given 11/22/19 1338)  heparin bolus via infusion 3,000 Units (3,000 Units Intravenous Bolus from Bag 11/22/19 1554)  apixaban (ELIQUIS) Education Kit for DVT/PE patients ( Does not apply Given 11/25/19 1425)    ED Course  I have reviewed the triage vital signs and the nursing notes.  Pertinent labs & imaging results that were available during my care of the patient were reviewed by me and considered in my medical decision making (see chart for details).  Clinical Course as of Nov 26 1525  Sat Nov 22, 2019  1215 Pleasant 86 year old brought in by granddaughter for evaluation of right leg pain.  Positive for DVT.  History of lower GI bleed last episode 1 year ago.  Likely will need anticoagulation but there risk-benefit will need to be sorted out.  Granddaughter says she is a fall risk.   [MB]    Clinical Course User Index [MB] Hayden Rasmussen, MD   MDM Rules/Calculators/A&P     Patient with a previous history of dementia, arthritis, GI bleed 03/2019 which was attributed to polyps. Has not had a colonoscopy or work-up for her previous GI bleed, it was assumed that it was likely due to her residual polyps during her previous colonoscopy.  She was advised to discontinue aspirin, Tylenol, prednisone.  Today she arrives with left leg swelling, sent here by PCP to rule out DVT.  Patient arrived in the ED today as she was advised to return for ultrasound of her left lower leg, Lovenox was not given to her yesterday as she had a previous history of  GI bleeding.  Lab work was obtained yesterday, CBC without  any leukocytosis, hemoglobin was stable at 14.7.  BMP showed a creatinine level of 0.8, no electrolyte derangement, no signs of infection.   DVT study + - Findings consistent with acute deep vein thrombosis involving the left  femoral vein, left popliteal vein, left posterior tibial veins, left  peroneal veins, and left gastrocnemius veins.   12:01 PM Spoke to heme on call, appreciate their help.  Seeing as patient source of bleeding was not determined on her last GI bleed.  Oral anticoagulations cannot be used at this time.  Recommendation was based on consulting vascular surgery, might be worthwhile for patient to be admitted for a heparin drip and to monitor for bleeding.  12:14 PM Spoke to Dr. Oneida Alar, vascular surgery who reported patient is not a candidate for lysis.   2:25 PM received from radiologist reports a left and right saddle pulmonary embolism with small right heart strain.  She is hemodynamically stable.  CT Angio & Pelvis:  Large linear saddle pulmonary embolus is noted which extends into  both the right and left pulmonary arteries and their peripheral  branches. RV/LV ratio is 1.0 suggesting right heart strain. Positive  for acute PE with CT evidence of right heart strain (RV/LV Ratio =  1.0) consistent with at least submassive (intermediate risk) PE. The  presence of right heart strain has been associated with an increased  risk of morbidity and mortality. Please activate Code PE by paging  [phone]. Critical Value/emergent results were called by  telephone at the time of interpretation on 11/22/2019 at 2:26 pm to  provider [facility] , who verbally acknowledged these results.    Aortic Atherosclerosis (ICD10-I70.0).   I have discussed the results of CT scan with granddaughter and patient at length.  Will now initiate heparin for patient.  She also may need admission into hospitalist service.  I have also  consulted pharmacy in order to educate patient of risks versus benefits.  4:02 PM spoke to internal medicine service who will now admit patient for further management of her pulmonary embolism along with DVT.  Family at the bedside has been informed of management and procedure.  Portions of this note were generated with Lobbyist. Dictation errors may occur despite best attempts at proofreading.  Final Clinical Impression(s) / ED Diagnoses Final diagnoses:  Leg DVT (deep venous thromboembolism), acute, left ([place])  Acute saddle pulmonary embolism, unspecified whether acute cor pulmonale present ([place])    Rx / DC Orders ED Discharge Orders         Ordered    apixaban (ELIQUIS) 5 MG TABS tablet  2 times daily     11/25/19 0942    apixaban (ELIQUIS) 5 MG TABS tablet  2 times daily     11/25/19 0942    calcium carbonate (CALCIUM 600) 600 MG TABS tablet  Daily with breakfast     11/25/19 0942    Increase activity slowly     11/25/19 0942    Diet - low sodium heart healthy     11/25/19 0942           Janeece Fitting, PA-C 11/22/19 1602    Hayden Rasmussen, MD 11/22/19 2220    Janeece Fitting, PA-C 11/27/19 1527    Hayden Rasmussen, MD 11/27/19 1946

## 2019-11-22 NOTE — Progress Notes (Signed)
ANTICOAGULATION CONSULT NOTE - Follow Up Consult  Pharmacy Consult for heparin Indication: PE/DVT  Labs: Recent Labs    11/21/19 2137 11/22/19 1450 11/22/19 2255  HGB 14.7 14.8  --   HCT 45.5 45.2  --   PLT 181 150  --   LABPROT  --  13.1  --   INR  --  1.0  --   HEPARINUNFRC  --   --  0.48  CREATININE 0.80 0.78  --     Assessment/Plan:  86yo female therapeutic on heparin with initial dosing for PE and DVT. Will continue gtt at current rate and confirm stable with am labs.   Vernard Gambles, PharmD, BCPS  11/22/2019,11:56 PM

## 2019-11-22 NOTE — ED Triage Notes (Signed)
Pt's granddaughter stated, she just came from xray she is positive for a DVT rt. Lower leg. Swelling started on Thursday, not sure when the pain started.

## 2019-11-22 NOTE — Progress Notes (Signed)
ANTICOAGULATION CONSULT NOTE - Initial Consult  Pharmacy Consult for heparin Indication: pulmonary embolus  Allergies  Allergen Reactions  . Azithromycin Swelling    Patient Measurements:   Heparin Dosing Weight: 60kg  Vital Signs: Temp: 97.3 F (36.3 C) (02/06 1107) Temp Source: Oral (02/06 1107) BP: 158/75 (02/06 1107) Pulse Rate: 79 (02/06 1107)  Labs: Recent Labs    11/21/19 2137  HGB 14.7  HCT 45.5  PLT 181  CREATININE 0.80    CrCl cannot be calculated (Unknown ideal weight.).   Medical History: Past Medical History:  Diagnosis Date  . Dementia ([facility])   . Hypertension   . Hypothyroidism     Assessment: 67 YOF presenting with left leg swelling, sent for DVT r/o, DVT +.  Now also with L+R saddle PE with some RH strain.  Not on anticoagulation PTA, hx of GI bleed.  CBC wnl.    Goal of Therapy:  Heparin level 0.3-0.7 units/ml Monitor platelets by anticoagulation protocol: Yes   Plan:  Heparin 3000 units IV x 1, and gtt at 950 units/hr F/u 8 hour heparin level  Daylene Posey, PharmD Clinical Pharmacist Please check AMION for all [facility] Pharmacy numbers 11/22/2019 2:59 PM

## 2019-11-22 NOTE — H&P (Signed)
Date: 11/22/2019               Patient Name:  Catherine Hess MRN: [id number]  DOB: Mar 05, 1925 Age / Sex: 86 y.o., female   PCP: Tacy Learn, FNP         Medical Service: Internal Medicine Teaching Service         Attending Physician: Dr. Inez Catalina, MD    First Contact: Dr. Ronelle Nigh Pager: [phone]  Second Contact: Dr. Delma Officer Pager: [phone]       After Hours (After 5p/  First Contact Pager: [phone]  weekends / holidays): Second Contact Pager: [phone]   Chief Complaint: Left leg swelling  History of Present Illness:  Catherine Hess is a 86 year old female with htn, hypothyroidism, dementia who presented with left lower extremity dvt. The patient states that she has been having worsening left leg pain, swelling, and weeping for the past few days but unsure the exact timeline as patient has chronic pain. The patient was evaluated at her pcp's office and there was concern for dvt which prompted patient to come to [facility] for evaluation on 2/5. She was scheduled for an ultrasound on 2/6. She not given lovenox and provided with keflex prescription to be taken if ultrasound negative for clot.   She has also had worsening breathing for the past 1 month. Granddaughter put a humidifier in the patient's room and patient has been using rawleigh every night as she felt she had "nasal problems"   Patient never had any prior blood clots and there is no family history of blood clots or strokes. Denies recent travel or recent surgeries. She was very active till one year ago and slowed down due to arthritis that is severe. She has not been moving around much in house other than using walker to go to bathroom. Her meals are brought to her.   Of note the patient had a GI bleed (hb drop from 13.6 to 12.4) that was suspected to be due to diverticulosis per colonoscopy that was done in 2018. The patient was managed conservatively during this time without any invasive intervention.   Has also had  dizziness. Denies fevers/chills, nausea, vomiting, chest pain, abdominal pain, dysuria, diarrhea/constipation.  In the ED, patient was afebrile, mildly hypertensive, saturating in 90s on room air.   Meds:  Current Meds  Medication Sig  . acetaminophen (TYLENOL) 650 MG CR tablet Take 650 mg by mouth every 8 (eight) hours as needed for pain.  . Ascorbic Acid (VITAMIN C) 1000 MG tablet Take 1,000 mg by mouth daily.  . Calcium Carb-Cholecalciferol (CALCIUM 600-D PO) Take 1 tablet by mouth at bedtime.  . Carboxymethylcellulose Sodium (REFRESH TEARS OP) Place 1 drop into both eyes 4 (four) times daily.  . diclofenac Sodium (VOLTAREN) 1 % GEL Apply 1 application topically daily as needed (pain).  Marland Kitchen ELDERBERRY PO Take 2 tablets by mouth daily.  . Ginkgo Biloba Extract 60 MG CAPS Take 60 mg by mouth at bedtime.  Marland Kitchen levothyroxine (SYNTHROID) 125 MCG tablet Take 125 mcg by mouth daily before breakfast.   . loratadine (CLARITIN) 10 MG tablet Take 10 mg by mouth daily.  Marland Kitchen losartan (COZAAR) 50 MG tablet Take 25 mg by mouth daily.   . metoprolol tartrate (LOPRESSOR) 25 MG tablet Take 12.5 mg by mouth 2 (two) times daily.   . Multiple Vitamin (MULTIVITAMIN WITH MINERALS) TABS tablet Take 1 tablet by mouth daily.  Marland Kitchen omeprazole (PRILOSEC OTC) 20 MG tablet  Take 20 mg by mouth daily.  . predniSONE (DELTASONE) 10 MG tablet Take 10 mg by mouth daily.  . simvastatin (ZOCOR) 20 MG tablet Take 20 mg by mouth at bedtime.      Allergies: Allergies as of 11/22/2019 - Review Complete 11/22/2019  Allergen Reaction Noted  . Azithromycin Swelling 11/27/2018   Past Medical History:  Diagnosis Date  . Dementia (HCC)   . Hypertension   . Hypothyroidism     Family History:   Hypertension daughter Diabetes daughter  Cancer sister (non hodgkin), brother , mother (pancreatic), niece (pancreatic) Charcot marie tooth-brother  parkinsons-brother Personal assistant sisters  Social History:  Lives with daughter and son in  Social worker in [place]  No etoh, tobacco, drug use    Review of Systems: A complete ROS was negative except as per HPI.   Physical Exam: Blood pressure (!) 158/75, pulse 79, temperature (!) 97.3 F (36.3 C), temperature source Oral, resp. rate 14, weight 60.3 kg, SpO2 93 %.  Physical Exam  Constitutional: She appears well-developed and well-nourished. No distress.  HENT:  Head: Normocephalic and atraumatic.  Eyes: Conjunctivae are normal.  Cardiovascular: Normal rate, regular rhythm and normal heart sounds.  Respiratory: Effort normal and breath sounds normal. No respiratory distress. She has no wheezes.  No accessory muscle use   GI: Soft. Bowel sounds are normal. She exhibits no distension. There is no abdominal tenderness.  Musculoskeletal:        General: No edema.  Neurological: She is alert.  Skin: She is not diaphoretic. There is erythema (purpuric area on lower extremities ).   EKG: not done  CXR: personally reviewed my interpretation is no pleural effusion or consolidation  CTA chest 2/6 Large linear saddle pulmonary embolus is noted which extends into both the right and left pulmonary arteries and their peripheral branches. RV/LV ratio is 1.0 suggesting right heart strain. Positive for acute PE with CT evidence of right heart strain (RV/LV Ratio = 1.0) consistent with at least submassive (intermediate risk) PE. The presence of right heart strain has been associated with an increased risk of morbidity and mortality.   Vas LE DVT RIGHT:  No evidence of common femoral vein obstruction.    LEFT:  Findings consistent with acute deep vein thrombosis involving the left  femoral vein, left popliteal vein, left posterior tibial veins, left  peroneal veins, and left gastrocnemius veins.   Assessment & Plan by Problem: Active Problems:   Saddle pulmonary embolus (HCC)  Catherine Hess is a 86 y.o female with hx of gi bleeding in June 2020, hypothyroidism who presented with  worsening left leg swelling and dyspnea.   Acute saddle pulmonary embolism with right heart strain CTA chest shows large linear saddle pe which extends into right and left pulmonary arteries and their branches. RV/LV ratio is 1.  Patient has a pesi score of 92 which places her at intermediate risk. Will admit patient for close monitoring of oxygen needs and bleeding. Will need to be careful to monitor for bleeding risk given history of GI bleed.   -continue iv heparin, appreciate pharmacy assistance with dosing -monitor oxygen saturation carefully   Hypothyroidism Patient is on leveothryoxine qam.   -Continue levothyroxine  Hypertension  Patient takes losartan, metoprolol at home. Will hold bp meds given large pe.   Severe arthritis According to patient's granddaughter she has severe arthritis and is being given prednisone 10mg  qd.   -Will continue prednisone 10mg  qd due to it being a chronic medication  Dispo: Admit patient to Inpatient with expected length of stay greater than 2 midnights.  SignedLars Mage, MD 11/22/2019, 4:35 PM

## 2019-11-23 DIAGNOSIS — I10 Essential (primary) hypertension: Secondary | ICD-10-CM

## 2019-11-23 DIAGNOSIS — Z8719 Personal history of other diseases of the digestive system: Secondary | ICD-10-CM

## 2019-11-23 DIAGNOSIS — I82402 Acute embolism and thrombosis of unspecified deep veins of left lower extremity: Secondary | ICD-10-CM

## 2019-11-23 DIAGNOSIS — F0390 Unspecified dementia without behavioral disturbance: Secondary | ICD-10-CM

## 2019-11-23 LAB — CBC
HCT: 40.6 % (ref 36.0–46.0)
Hemoglobin: 13.1 g/dL (ref 12.0–15.0)
MCH: 31.8 pg (ref 26.0–34.0)
MCHC: 32.3 g/dL (ref 30.0–36.0)
MCV: 98.5 fL (ref 80.0–100.0)
Platelets: 172 10*3/uL (ref 150–400)
RBC: 4.12 MIL/uL (ref 3.87–5.11)
RDW: 13.2 % (ref 11.5–15.5)
WBC: 7 10*3/uL (ref 4.0–10.5)
nRBC: 0 % (ref 0.0–0.2)

## 2019-11-23 LAB — BASIC METABOLIC PANEL
Anion gap: 9 (ref 5–15)
BUN: 10 mg/dL (ref 8–23)
CO2: 23 mmol/L (ref 22–32)
Calcium: 9.3 mg/dL (ref 8.9–10.3)
Chloride: 109 mmol/L (ref 98–111)
Creatinine, Ser: 0.69 mg/dL (ref 0.44–1.00)
GFR calc Af Amer: >60 mL/min (ref >60)
GFR calc non Af Amer: >60 mL/min (ref >60)
Glucose, Bld: 100 mg/dL — ABNORMAL HIGH (ref 70–99)
Potassium: 3.8 mmol/L (ref 3.5–5.1)
Sodium: 141 mmol/L (ref 135–145)

## 2019-11-23 LAB — HEPARIN LEVEL (UNFRACTIONATED): Heparin Unfractionated: 0.4 IU/mL (ref 0.30–0.70)

## 2019-11-23 MED ORDER — APIXABAN 5 MG PO TABS: 5.0000 mg | ORAL_TABLET | Freq: Two times a day (BID) | ORAL | Status: DC

## 2019-11-23 MED ORDER — APIXABAN 5 MG PO TABS: 10.0000 mg | ORAL_TABLET | Freq: Two times a day (BID) | ORAL | Status: DC

## 2019-11-23 MED ORDER — LOSARTAN POTASSIUM 25 MG PO TABS
25.0000 mg | ORAL_TABLET | Freq: Every day | ORAL | Status: DC
  Administered 2019-11-23 – 2019-11-25 (×3): 25 mg via ORAL
  Filled 2019-11-23 (×3): qty 1

## 2019-11-23 MED ORDER — HEPARIN (PORCINE) 25000 UT/250ML-% IV SOLN: 950.0000 [IU]/h | INTRAVENOUS | Status: DC

## 2019-11-23 MED ORDER — APIXABAN 5 MG PO TABS
10.0000 mg | ORAL_TABLET | Freq: Two times a day (BID) | ORAL | Status: DC
  Administered 2019-11-23 – 2019-11-25 (×5): 10 mg via ORAL
  Filled 2019-11-23 (×5): qty 2

## 2019-11-23 NOTE — Discharge Instructions (Addendum)
It was a pleasure taking care of you, Catherine Hess.  You were hospitalized for a clot in your lungs called a pulmonary embolism.  We started you on an anticoagulation medicine called heparin while you were in the hospital.  We then switched you to an oral medication called apixaban (brand name is Eliquis).  It is important that you take this medication twice daily, and we anticipate that you will have to continue to take this medication on a long-term basis to prevent clots from occurring again.  We discussed the risks and benefits of taking this medication with you and your family member. The biggest risk of Eliquis is bleeding.  We took this into consideration given your history of diverticulosis and a gastrointestinal bleed (June 2020), but overall, we believe the benefits of anticoagulation to treat with clot and prevent a future clot outweigh the risks of bleeding at this time.  We recommend that you follow up with your primary care doctor within the next week.    Please also stop taking your Ginkgo Biloba Extract as this can interact with the Eliquis medication and increase the risk of bleeding  We have included information on Eliquis, deep vein thrombosis, and pullmonary embolism in the text below.  Information on my medicine - ELIQUIS (apixaban)  Why was Eliquis prescribed for you? Eliquis was prescribed to treat blood clots that may have been found in the veins of your legs (deep vein thrombosis) or in your lungs (pulmonary embolism) and to reduce the risk of them occurring again.  What do You need to know about Eliquis ? The starting dose is 10 mg (two 5 mg tablets) taken TWICE daily for the FIRST SEVEN (7) DAYS, then on 11/30/2019 the dose is reduced to ONE 5 mg tablet taken TWICE daily.  Eliquis may be taken with or without food.   Try to take the dose about the same time in the morning and in the evening. If you have difficulty swallowing the tablet whole please discuss with your  pharmacist how to take the medication safely.  Take Eliquis exactly as prescribed and DO NOT stop taking Eliquis without talking to the doctor who prescribed the medication.  Stopping may increase your risk of developing a new blood clot.  Refill your prescription before you run out.  After discharge, you should have regular check-up appointments with your healthcare provider that is prescribing your Eliquis.    What do you do if you miss a dose? If a dose of ELIQUIS is not taken at the scheduled time, take it as soon as possible on the same day and twice-daily administration should be resumed. The dose should not be doubled to make up for a missed dose.  Important Safety Information A possible side effect of Eliquis is bleeding. You should call your healthcare provider right away if you experience any of the following: ? Bleeding from an injury or your nose that does not stop. ? Unusual colored urine (red or dark brown) or unusual colored stools (red or black). ? Unusual bruising for unknown reasons. ? A serious fall or if you hit your head (even if there is no bleeding).  Some medicines may interact with Eliquis and might increase your risk of bleeding or clotting while on Eliquis. To help avoid this, consult your healthcare provider or pharmacist prior to using any new prescription or non-prescription medications, including herbals, vitamins, non-steroidal anti-inflammatory drugs (NSAIDs) and supplements.  This website has more information on Eliquis (apixaban): http://www.eliquis.com/eliquis/home  Deep Vein Thrombosis  Deep vein thrombosis (DVT) is a condition in which a blood clot forms in a deep vein, such as a lower leg, thigh, or arm vein. A clot is blood that has thickened into a gel or solid. This condition is dangerous. It can lead to serious and even life-threatening complications if the clot travels to the lungs and causes a blockage (pulmonary embolism). It can also  damage veins in the leg. This can result in leg pain, swelling, discoloration, and sores (post-thrombotic syndrome). What are the causes? This condition may be caused by:  A slowdown of blood flow.  Damage to a vein.  A condition that causes blood to clot more easily, such as an inherited clotting disorder. What increases the risk? The following factors may make you more likely to develop this condition:  Being overweight.  Being older, especially over age 7.  Sitting or lying down for more than four hours.  Being in the hospital.  Lack of physical activity (sedentary lifestyle).  Pregnancy, being in childbirth, or having recently given birth.  Taking medicines that contain estrogen, such as medicines to prevent pregnancy.  Smoking.  A history of any of the following: ? Blood clots or a blood clotting disease. ? Peripheral vascular disease. ? Inflammatory bowel disease. ? Cancer. ? Heart disease. ? Genetic conditions that affect how your blood clots, such as Factor V Leiden mutation. ? Neurological diseases that affect your legs (leg paresis). ? A recent injury, such as a car accident. ? Major or lengthy surgery. ? A central line placed inside a large vein. What are the signs or symptoms? Symptoms of this condition include:  Swelling, pain, or tenderness in an arm or leg.  Warmth, redness, or discoloration in an arm or leg. If the clot is in your leg, symptoms may be more noticeable or worse when you stand or walk. Some people may not develop any symptoms. How is this diagnosed? This condition is diagnosed with:  A medical history and physical exam.  Tests, such as: ? Blood tests. These are done to check how well your blood clots. ? Ultrasound. This is done to check for clots. ? Venogram. For this test, contrast dye is injected into a vein and X-rays are taken to check for any clots. How is this treated? Treatment for this condition depends on:  The cause of  your DVT.  Your risk for bleeding or developing more clots.  Any other medical conditions that you have. Treatment may include:  Taking a blood thinner (anticoagulant). This type of medicine prevents clots from forming. It may be taken by mouth, injected under the skin, or injected through an IV (catheter).  Injecting clot-dissolving medicines into the affected vein (catheter-directed thrombolysis).  Having surgery. Surgery may be done to: ? Remove the clot. ? Place a filter in a large vein to catch blood clots before they reach the lungs. Some treatments may be continued for up to six months. Follow these instructions at home: If you are taking blood thinners:  Take the medicine exactly as told by your health care provider. Some blood thinners need to be taken at the same time every day. Do not skip a dose.  Talk with your health care provider before you take any medicines that contain aspirin or NSAIDs. These medicines increase your risk for dangerous bleeding.  Ask your health care provider about foods and drugs that could change the way the medicine works (may interact). Avoid those things if  your health care provider tells you to do so.  Blood thinners can cause easy bruising and may make it difficult to stop bleeding. Because of this: ? Be very careful when using knives, scissors, or other sharp objects. ? Use an electric razor instead of a blade. ? Avoid activities that could cause injury or bruising, and follow instructions about how to prevent falls.  Wear a medical alert bracelet or carry a card that lists what medicines you take. General instructions  Take over-the-counter and prescription medicines only as told by your health care provider.  Return to your normal activities as told by your health care provider. Ask your health care provider what activities are safe for you.  Wear compression stockings if recommended by your health care provider.  Keep all follow-up  visits as told by your health care provider. This is important. How is this prevented? To lower your risk of developing this condition again:  For 30 or more minutes every day, do an activity that: ? Involves moving your arms and legs. ? Increases your heart rate.  When traveling for longer than four hours: ? Exercise your arms and legs every hour. ? Drink plenty of water. ? Avoid drinking alcohol.  Avoid sitting or lying for a long time without moving your legs.  If you have surgery or you are hospitalized, ask about ways to prevent blood clots. These may include taking frequent walks or using anticoagulants.  Stay at a healthy weight.  If you are a woman who is older than age 25, avoid unnecessary use of medicines that contain estrogen, such as some birth control pills.  Do not use any products that contain nicotine or tobacco, such as cigarettes and e-cigarettes. This is especially important if you take estrogen medicines. If you need help quitting, ask your health care provider. Contact a health care provider if:  You miss a dose of your blood thinner.  Your menstrual period is heavier than usual.  You have unusual bruising. Get help right away if:  You have: ? New or increased pain, swelling, or redness in an arm or leg. ? Numbness or tingling in an arm or leg. ? Shortness of breath. ? Chest pain. ? A rapid or irregular heartbeat. ? A severe headache or confusion. ? A cut that will not stop bleeding.  There is blood in your vomit, stool, or urine.  You have a serious fall or accident, or you hit your head.  You feel light-headed or dizzy.  You cough up blood. These symptoms may represent a serious problem that is an emergency. Do not wait to see if the symptoms will go away. Get medical help right away. Call your local emergency services (911 in the [place] Do not drive yourself to the hospital. Summary  Deep vein thrombosis (DVT) is a condition in which a blood  clot forms in a deep vein, such as a lower leg, thigh, or arm vein.  Symptoms can include swelling, warmth, pain, and redness in your leg or arm.  This condition may be treated with a blood thinner (anticoagulant medicine), medicine that is injected to dissolve blood clots,compression stockings, or surgery.  If you are prescribed blood thinners, take them exactly as told. This information is not intended to replace advice given to you by your health care provider. Make sure you discuss any questions you have with your health care provider. Document Revised: 09/14/2017 Document Reviewed: 03/02/2017 Elsevier Patient Education  2020 [employer].   Pulmonary  Embolism  A pulmonary embolism (PE) is a sudden blockage or decrease of blood flow in one or both lungs. Most blockages come from a blood clot that forms in the vein of a lower leg, thigh, or arm (deep vein thrombosis, DVT) and travels to the lungs. A clot is blood that has thickened into a gel or solid. PE is a dangerous and life-threatening condition that needs to be treated right away. What are the causes? This condition is usually caused by a blood clot that forms in a vein and moves to the lungs. In rare cases, it may be caused by air, fat, part of a tumor, or other tissue that moves through the veins and into the lungs. What increases the risk? The following factors may make you more likely to develop this condition:  Experiencing a traumatic injury, such as breaking a hip or leg.  Having: ? A spinal cord injury. ? Orthopedic surgery, especially hip or knee replacement. ? Any major surgery. ? A stroke. ? DVT. ? Blood clots or blood clotting disease. ? Long-term (chronic) lung or heart disease. ? Cancer treated with chemotherapy. ? A central venous catheter.  Taking medicines that contain estrogen. These include birth control pills and hormone replacement therapy.  Being: ? Pregnant. ? In the period of time after your baby  is delivered (postpartum). ? Older than age 73. ? Overweight. ? A smoker, especially if you have other risks. What are the signs or symptoms? Symptoms of this condition usually start suddenly and include:  Shortness of breath during activity or at rest.  Coughing, coughing up blood, or coughing up blood-tinged mucus.  Chest pain that is often worse with deep breaths.  Rapid or irregular heartbeat.  Feeling light-headed or dizzy.  Fainting.  Feeling anxious.  Fever.  Sweating.  Pain and swelling in a leg. This is a symptom of DVT, which can lead to PE. How is this diagnosed? This condition may be diagnosed based on:  Your medical history.  A physical exam.  Blood tests.  CT pulmonary angiogram. This test checks blood flow in and around your lungs.  Ventilation-perfusion scan, also called a lung VQ scan. This test measures air flow and blood flow to the lungs.  An ultrasound of the legs. How is this treated? Treatment for this condition depends on many factors, such as the cause of your PE, your risk for bleeding or developing more clots, and other medical conditions you have. Treatment aims to remove, dissolve, or stop blood clots from forming or growing larger. Treatment may include:  Medicines, such as: ? Blood thinning medicines (anticoagulants) to stop clots from forming. ? Medicines that dissolve clots (thrombolytics).  Procedures, such as: ? Using a flexible tube to remove a blood clot (embolectomy) or to deliver medicine to destroy it (catheter-directed thrombolysis). ? Inserting a filter into a large vein that carries blood to the heart (inferior vena cava). This filter (vena cava filter) catches blood clots before they reach the lungs. ? Surgery to remove the clot (surgical embolectomy). This is rare. You may need a combination of immediate, long-term (up to 3 months after diagnosis), and extended (more than 3 months after diagnosis) treatments. Your  treatment may continue for several months (maintenance therapy). You and your health care provider will work together to choose the treatment program that is best for you. Follow these instructions at home: Medicines  Take over-the-counter and prescription medicines only as told by your health care provider.  If you  are taking an anticoagulant medicine: ? Take the medicine every day at the same time each day. ? Understand what foods and drugs interact with your medicine. ? Understand the side effects of this medicine, including excessive bruising or bleeding. Ask your health care provider or pharmacist about other side effects. General instructions  Wear a medical alert bracelet or carry a medical alert card that says you have had a PE and lists what medicines you take.  Ask your health care provider when you may return to your normal activities. Avoid sitting or lying for a long time without moving.  Maintain a healthy weight. Ask your health care provider what weight is healthy for you.  Do not use any products that contain nicotine or tobacco, such as cigarettes, e-cigarettes, and chewing tobacco. If you need help quitting, ask your health care provider.  Talk with your health care provider about any travel plans. It is important to make sure that you are still able to take your medicine while on trips.  Keep all follow-up visits as told by your health care provider. This is important. Contact a health care provider if:  You missed a dose of your blood thinner medicine. Get help right away if:  You have: ? New or increased pain, swelling, warmth, or redness in an arm or leg. ? Numbness or tingling in an arm or leg. ? Shortness of breath during activity or at rest. ? A fever. ? Chest pain. ? A rapid or irregular heartbeat. ? A severe headache. ? Vision changes. ? A serious fall or accident, or you hit your head. ? Stomach (abdominal) pain. ? Blood in your vomit, stool, or  urine. ? A cut that will not stop bleeding.  You cough up blood.  You feel light-headed or dizzy.  You cannot move your arms or legs.  You are confused or have memory loss. These symptoms may represent a serious problem that is an emergency. Do not wait to see if the symptoms will go away. Get medical help right away. Call your local emergency services (911 in the [place] Do not drive yourself to the hospital. Summary  A pulmonary embolism (PE) is a sudden blockage or decrease of blood flow in one or both lungs. PE is a dangerous and life-threatening condition that needs to be treated right away.  Treatments for this condition usually include medicines to thin your blood (anticoagulants) or medicines to break apart blood clots (thrombolytics).  If you are given blood thinners, it is important to take the medicine every day at the same time each day.  Understand what foods and drugs interact with any medicines that you are taking.  If you have signs of PE or DVT, call your local emergency services (911 in the [place] This information is not intended to replace advice given to you by your health care provider. Make sure you discuss any questions you have with your health care provider. Document Revised: 07/10/2018 Document Reviewed: 07/10/2018 Elsevier Patient Education  2020 [employer].

## 2019-11-23 NOTE — Progress Notes (Signed)
  Subjective:   Catherine Hess was seen sitting up in bed eating breakfast. She stated that she is doing well. She mentioned "since you put this thing in my nose I feel better. It has kept my nose moist."  Spoke with daughter Catherine Hess) and granddaughter Catherine Hess) on phone and spoke about starting Eliquis. Family and patient are agreeable to starting this medication.  Objective:    Vital Signs (last 24 hours): Vitals:   11/22/19 1730 11/22/19 1928 11/22/19 2318 11/23/19 0335  BP: (!) 165/86  (!) 156/82 (!) 151/80  Pulse: 84  85 86  Resp: 20     Temp:  99 F (37.2 C) 98.1 F (36.7 C) 98 F (36.7 C)  TempSrc:  Oral Oral Oral  SpO2: 100%  91% 90%  Weight:    60.1 kg    Physical Exam: General Alert and answers questions appropriately, no acute distress  Cardiac Regular rate and rhythm, no murmurs, rubs, or gallops  Pulmonary Clear to auscultation bilaterally without wheezes, rhonchi, or rales   BMP Latest Ref Rng & Units 11/23/2019 11/22/2019 11/21/2019  Glucose 70 - 99 mg/dL 742(V) 956(L) 875(I)  BUN 8 - 23 mg/dL 10 13 15   Creatinine 0.44 - 1.00 mg/dL 4.33 2.95  Sodium 135 - 145 mmol/L 141 138 141  Potassium 3.5 - 5.1 mmol/L 3.8 3.7 3.9  Chloride 98 - 111 mmol/L 109 107 101  CO2 22 - 32 mmol/L 23 23 27   Calcium 8.9 - 10.3 mg/dL 9.3 9.6 1.88   CBC Latest Ref Rng & Units 11/23/2019 11/22/2019 11/21/2019  WBC 4.0 - 10.5 K/uL 7.0 8.2 10.4  Hemoglobin 12.0 - 15.0 g/dL 01/20/2020 01/19/2020 60.6  Hematocrit 36.0 - 46.0 % 40.6 45.2 45.5  Platelets 150 - 400 K/uL 172 150 181    Assessment/Plan:   Active Problems:   Saddle pulmonary embolus [facility])  Patient is a 86 year old female with past medical history significant for hypertension, hypothyroidism, dementia, history of GI bleed in June 2020 who presented on 11/22/2019 with worsening leg swelling and shortness of breath.  # Acute saddle pulmonary embolism right right heart strain: Outpatient lower extremity ultrasound performed on 2/6 in a.m. which  revealed an acute DVT in the left femoral, popliteal, posterior tibial, peroneal, and gastrocnemius veins.  With patient's dyspnea, she was sent for further evaluation to the ER.  A CT angiogram was performed which revealed a large linear saddle pulmonary embolism extending into the right and left pulmonary arteries and the branches.  Evidence for right heart strain with RV/LV ratio of 1.  Patient saturating well on room air today, hemodynamically stable. *Continue IV heparin, appreciate pharmacy's assistance with dosing *Monitor oxygen saturation *Will switch from IV heparin to Eliquis today *Will check pulse oximetry while ambulating  # Hypertension: Home losartan and metoprolol were initially held in setting of large PE with concerns for development to hemodynamic instability. At this time with persistent elevated blood pressure, will restart home losartan 25 mg daily  # Hypothyroidism: Continue levothyroxine 125 mcg every morning  # Severe arthritis: Patient with severe arthritis per granddaughter and has been on chronic prednisone 10 mg daily. Will continue prednisone 10 mg daily as it is chronic medication.  Diet: Regular DVT Ppx: Therapeutic heparin, eliquis Dispo: Anticipated discharge in approximately 1 days  July 2020, MD 11/23/2019, 6:00 AM Pager: [phone]

## 2019-11-23 NOTE — Progress Notes (Signed)
  Date: 11/23/2019  Patient name: Catherine Hess  Medical record number: [id number]  Date of birth: 05-Jul-1925   I have seen and evaluated Catherine Hess and discussed their care with the Residency Team. Briefly, Catherine Hess is a 86 year old woman with PMH of hypothyroidism and dementia who presented for leg pain.  She had an OP work up with ultrasound and CT scan.  She was found to have a DVT and PE.  She was admitted for treatment with heparin.  Today she is breathing well.  We removed her O2 in the room and she maintained oxygen saturation at 93% or higher.  She had no pain and no issues when we spoke with her.  She was hesitant to take medication at home, but we will discuss further with her daughter at her request.    Vitals:   11/22/19 2318 11/23/19 0335  BP: (!) 156/82 (!) 151/80  Pulse: 85 86  Resp:    Temp: 98.1 F (36.7 C) 98 F (36.7 C)  SpO2: 91% 90%   General: Lying in bed, NAD, wearing oxygen Eyes: Anicteric sclerae CV: Mildly tachycardic to the 90s, regular, no murmur Pulm: Breathing comfortably, no wheezing Abd: Soft, +BS MSK: Normal muscle tone for age, no contractures Psych: Somewhat forgetful with questioning, alert, knows she is in the hospital  Assessment and Plan: I have seen and evaluated the patient as outlined above. I agree with the formulated Assessment and Plan as detailed in the residents' note, with the following changes:   1. Saddle PE, DVT - Change to Eliquis today, discuss with daughter - Only risk factor was being sedentary, will need at least 6 months of anticoagulation, possibly life long.  Discuss with PCP - She was saturating 93% on RA.  Consider ambulating with and without oxygen if she is able  Other issues per Resident note.   Catherine Catalina, MD 2/7/20218:09 AM

## 2019-11-23 NOTE — Progress Notes (Addendum)
ANTICOAGULATION CONSULT NOTE   Pharmacy Consult for heparin Indication: pulmonary embolus  Allergies  Allergen Reactions  . Azithromycin Swelling    Patient Measurements: Weight: 132 lb 7.9 oz (60.1 kg) Heparin Dosing Weight: 60kg  Vital Signs: Temp: 98 F (36.7 C) (02/07 0335) Temp Source: Oral (02/07 0335) BP: 151/80 (02/07 0335) Pulse Rate: 86 (02/07 0335)  Labs: Recent Labs    11/21/19 2137 11/21/19 2137 11/22/19 1450 11/22/19 2255 11/23/19 0547  HGB 14.7   < > 14.8  --  13.1  HCT 45.5  --  45.2  --  40.6  PLT 181  --  150  --  172  LABPROT  --   --  13.1  --   --   INR  --   --  1.0  --   --   HEPARINUNFRC  --   --   --  0.48 0.40  CREATININE 0.80  --  0.78  --  0.69   < > = values in this interval not displayed.    CrCl cannot be calculated (Unknown ideal weight.).   Medical History: Past Medical History:  Diagnosis Date  . Dementia (HCC)   . Hypertension   . Hypothyroidism     Assessment: 3 YOF presenting with left leg swelling, sent for DVT r/o, DVT +.  Now also with L+R saddle PE with some RH strain.  Not on anticoagulation PTA, hx of GI bleed.    Heparin level this morning came back therapeutic at 0.4, on 950 units/hr. Hgb 13.1, plt 172. No s/sx of bleeding or infusion issues per nursing.   Goal of Therapy:  Heparin level 0.3-0.7 units/ml Monitor platelets by anticoagulation protocol: Yes   Plan:  Continue heparin gtt at 950 units/hr Monitor daily HL, CBC, for s/sx of bleeding  F/u transition to oral AC   Sherron Monday, PharmD, BCCCP Clinical Pharmacist  Phone: [phone]  Please check AMION for all [facility] Pharmacy phone numbers After 10:00 PM, call Main Pharmacy [phone] 11/23/2019 8:04 AM   ADDENDUM Plan to transition to oral anticoagulation with apixaban. Given saddle PE and DVT, qualifies for 10 mg BID for 7 days followed by 5 mg BID. Will need to monitor bleed risk closely (low wt 60 kg, age 86 yof)- discussed with team.   Sherron Monday, PharmD, BCCCP Clinical Pharmacist

## 2019-11-24 DIAGNOSIS — Z79890 Hormone replacement therapy (postmenopausal): Secondary | ICD-10-CM

## 2019-11-24 DIAGNOSIS — M1990 Unspecified osteoarthritis, unspecified site: Secondary | ICD-10-CM

## 2019-11-24 DIAGNOSIS — Z7952 Long term (current) use of systemic steroids: Secondary | ICD-10-CM

## 2019-11-24 DIAGNOSIS — I82452 Acute embolism and thrombosis of left peroneal vein: Secondary | ICD-10-CM

## 2019-11-24 DIAGNOSIS — I119 Hypertensive heart disease without heart failure: Secondary | ICD-10-CM

## 2019-11-24 DIAGNOSIS — I82462 Acute embolism and thrombosis of left calf muscular vein: Secondary | ICD-10-CM

## 2019-11-24 DIAGNOSIS — I82412 Acute embolism and thrombosis of left femoral vein: Secondary | ICD-10-CM

## 2019-11-24 DIAGNOSIS — E039 Hypothyroidism, unspecified: Secondary | ICD-10-CM

## 2019-11-24 DIAGNOSIS — I2692 Saddle embolus of pulmonary artery without acute cor pulmonale: Principal | ICD-10-CM

## 2019-11-24 DIAGNOSIS — Z79899 Other long term (current) drug therapy: Secondary | ICD-10-CM

## 2019-11-24 DIAGNOSIS — I82432 Acute embolism and thrombosis of left popliteal vein: Secondary | ICD-10-CM

## 2019-11-24 DIAGNOSIS — I82442 Acute embolism and thrombosis of left tibial vein: Secondary | ICD-10-CM

## 2019-11-24 LAB — CBC
HCT: 38.3 % (ref 36.0–46.0)
Hemoglobin: 12.8 g/dL (ref 12.0–15.0)
MCH: 32.2 pg (ref 26.0–34.0)
MCHC: 33.4 g/dL (ref 30.0–36.0)
MCV: 96.2 fL (ref 80.0–100.0)
Platelets: 178 10*3/uL (ref 150–400)
RBC: 3.98 MIL/uL (ref 3.87–5.11)
RDW: 12.9 % (ref 11.5–15.5)
WBC: 7.4 10*3/uL (ref 4.0–10.5)
nRBC: 0 % (ref 0.0–0.2)

## 2019-11-24 MED ORDER — SALINE SPRAY 0.65 % NA SOLN
1.0000 | NASAL | Status: DC | PRN
  Administered 2019-11-24: 1 via NASAL
  Filled 2019-11-24: qty 44

## 2019-11-24 MED ORDER — POLYETHYLENE GLYCOL 3350 17 G PO PACK
17.0000 g | PACK | Freq: Every day | ORAL | Status: DC
  Administered 2019-11-24 – 2019-11-25 (×2): 17 g via ORAL
  Filled 2019-11-24 (×2): qty 1

## 2019-11-24 NOTE — TOC Initial Note (Signed)
Transition of Care [facility]) - Initial/Assessment Note    Patient Details  Name: Catherine Hess MRN: [id number] Date of Birth: 14-Jan-1925  Transition [facility]) CM/SW Contact:    Cherylann Parr, RN Phone Number: 11/24/2019, 2:59 PM  Clinical Narrative:                 CM was unable to reach pt via phone - CM was able to reach pts daughter and granddaughter.  Daughter confirms that pt lives with her and that pts granddaughter stays with pts during the day while daughter works.  Neither daughter nor granddaughter voiced concerns with pt returning to the home at discharge.  Granddaughter informed CM that pt had been active with [place] - family interested in agency for recommended/ordered [facility].  CM shared medicare.gov results for agency.  CM contacted agencies and referral given - CM faxed requested documents to [place] at [phone].  Agency to follow up with CM  Expected Discharge Plan: Home w Home Health Services Barriers to Discharge: Continued Medical Work up   Patient Goals and CMS Choice     Choice offered to / list presented to : Adult Children(Also granddaughter)  Expected Discharge Plan and Services Expected Discharge Plan: Home w Home Health Services       Living arrangements for the past 2 months: Single Family Home                           HH Arranged: PT HH Agency: [facility] Date [facility] Agency Contacted: 11/24/19 Time HH Agency Contacted: 1458 Representative spoke with at [facility] Agency: Baird Lyons  Prior Living Arrangements/Services Living arrangements for the past 2 months: Single Family Home Lives with:: Adult Children Patient language and need for interpreter reviewed:: Yes        Need for Family Participation in Patient Care: Yes (Comment) Care giver support system in place?: Yes (comment) Current home services: DME(walker, 3:1, sliding bath chair, bars on tub) Criminal Activity/Legal Involvement Pertinent to Current  Situation/Hospitalization: No - Comment as needed  Activities of Daily Living Home Assistive Devices/Equipment: Wheelchair, Environmental consultant (specify type) ADL Screening (condition at time of admission) Patient's cognitive ability adequate to safely complete daily activities?: No Is the patient deaf or have difficulty hearing?: Yes Does the patient have difficulty seeing, even when wearing glasses/contacts?: No Does the patient have difficulty concentrating, remembering, or making decisions?: Yes Patient able to express need for assistance with ADLs?: Yes Does the patient have difficulty dressing or bathing?: Yes Independently performs ADLs?: No Communication: Independent Dressing (OT): Needs assistance Is this a change from baseline?: Pre-admission baseline Grooming: Needs assistance Is this a change from baseline?: Pre-admission baseline Feeding: Needs assistance Is this a change from baseline?: Pre-admission baseline Bathing: Needs assistance Is this a change from baseline?: Pre-admission baseline Toileting: Needs assistance Is this a change from baseline?: Pre-admission baseline In/Out Bed: Needs assistance Is this a change from baseline?: Pre-admission baseline Walks in Home: Needs assistance Is this a change from baseline?: Pre-admission baseline Does the patient have difficulty walking or climbing stairs?: Yes Weakness of Legs: Both Weakness of Arms/Hands: Both  Permission Sought/Granted   Permission granted to share information with : Yes, Verbal Permission Granted              Emotional Assessment           Psych Involvement: No (comment)  Admission diagnosis:  Saddle pulmonary embolus (HCC) [I26.92] Leg DVT (deep venous  thromboembolism), acute, left ([place]) [I82.402] Acute saddle pulmonary embolism, unspecified whether acute cor pulmonale present [facility]) [I26.92] Patient Active Problem List   Diagnosis Date Noted  . Leg DVT (deep venous thromboembolism), acute, left  ([place])   . Saddle pulmonary embolus ([place]) 11/22/2019  . Acute GI bleeding 04/05/2019  . Essential hypertension 04/05/2019  . Hypothyroidism 04/05/2019   PCP:  Beola Cord, Pier[place] Pharmacy:   [place], [place] [address] Phone: [phone] Fax: [phone]     Social Determinants of Health (SDOH) Interventions    Readmission Risk Interventions No flowsheet data found.

## 2019-11-24 NOTE — Progress Notes (Signed)
Subjective:   Overnight:  Pt with brief chest discomfort and was evaluated at bedside by night team.  Had RLQ cramping type pain that radiated up her right side that resolved spontaneously.  She denied any chest pain or shortness of breath.  Her main concern was her nose drying out and feeling stuffed up.  Vitals were stable, ecg unchanged without signs of acs, physical exam reassuring with soft non-tender abdomen, RRR, CTAB. Maintained O2 saturation of around 94% without supplemental O2.  Low suspicion for ACS or worsening PE.  This morning:  Patient lying comfortably in bed during interview. No longer experiencing chest pain.  She requested that team call her daughter/granddaugter to update them on the plan.  Collateral:  Spoke with patient's granddaughter, Colletta Maryland [phone]).  Colletta [place] expressed concern about her grandmother going home today.  Ms. Pinheiro lives with her daughter and son-in-law and they both work during the day, so she is left home alone.  Colletta Maryland lives down the street and checks on Ms. Glantz during the day while her parents are at work.  She is concerned about her grandmother being about to ambulate on her own at home without supervision.  She has a lift chair at home and uses a walker, but does not have a bedside commode.     Objective:    Vital Signs (last 24 hours): Vitals:   11/23/19 2017 11/23/19 2359 11/24/19 0024 11/24/19 0437  BP: (!) 168/86 (!) 182/86 (!) 167/86 (!) 173/78  Pulse: 87 99 81 82  Resp: 20 20 19  (!) 21  Temp: 98.2 F (36.8 C) 98.2 F (36.8 C) 98.3 F (36.8 C) 98.2 F (36.8 C)  TempSrc: Oral Oral Oral Oral  SpO2: 93% 95% 95% 92%  Weight:    59.6 kg    Physical Exam: General Alert and answers questions appropriately, no acute distress  Cardiac Regular rate and rhythm, no murmurs, rubs, or gallops  Pulmonary Clear to auscultation bilaterally without wheezes, rhonchi, or rales  Extremities             L LE non-pitting edema.     BMP  Latest Ref Rng & Units 11/23/2019 11/22/2019 11/21/2019  Glucose 70 - 99 mg/dL 100(H) 108(H) 132(H)  BUN 8 - 23 mg/dL 10 13 15   Creatinine 0.44 - 1.00 mg/dL 0.69 0.78 0.80  Sodium 135 - 145 mmol/L 141 138 141  Potassium 3.5 - 5.1 mmol/L 3.8 3.7 3.9  Chloride 98 - 111 mmol/L 109 107 101  CO2 22 - 32 mmol/L 23 23 27   Calcium 8.9 - 10.3 mg/dL 9.3 9.6 10.1   CBC Latest Ref Rng & Units 11/24/2019 11/23/2019 11/22/2019  WBC 4.0 - 10.5 K/uL 7.4 7.0 8.2  Hemoglobin 12.0 - 15.0 g/dL 12.8 13.1 14.8  Hematocrit 36.0 - 46.0 % 38.3 40.6 45.2  Platelets 150 - 400 K/uL 178 172 150    Assessment/Plan:   Active Problems:   Saddle pulmonary embolus (HCC)   Leg DVT (deep venous thromboembolism), acute, left [facility])  Patient is a 86 year old female with past medical history significant for hypertension, hypothyroidism, dementia, diverticulosis (per 6/22 discharge summary), of GI bleed in June 2020 who presented on 11/22/2019 with worsening leg swelling and shortness of breath.  The patient was evaluated at her pcp's office and there was concern for dvt which prompted patient to come to [facility] for evaluation on 2/5.  Admitted for unprovoked acute saddle pulmonary embolism.    # Acute saddle pulmonary embolism right right heart strain: Outpatient  lower extremity ultrasound performed on 2/6 in a.m. which revealed an acute DVT in the left femoral, popliteal, posterior tibial, peroneal, and gastrocnemius veins.  With patient's dyspnea, she was sent for further evaluation to the ER.  A CT angiogram was performed which revealed a large linear saddle pulmonary embolism extending into the right and left pulmonary arteries and the branches.  Evidence for right heart strain with RV/LV ratio of 1.  Patient saturating well on room air, hemodynamically stable.  HAS-BLED score 2 (hypertension, age>65) puts her at moderate risk for major bleed.  Discussed risks and benefits with family re: anticoagulation treatment given past GI bleed, and  transitioned from IV heparin to Eliquis on 2/7.  H/H wnl and patient stable on RA with ambulation. Given large clot burden and hx of GI bleed, will continue to monitor for one more day with plan to discharge tomorrow.   *Continue Eliquis 10 mg BID for 7 days (complete on 2/13), then 5 mg BID *Only risk factor was being sedentary, will need at least 6 months of anticoagulation, possibly life long.  Discuss with PCP *PT eval today --> recommended HH PT with 24 hour supervision/assistance  # Hypertension: Home losartan and metoprolol were initially held in setting of large PE with concerns for development to hemodynamic instability. With persistent elevated blood pressure, restarted home losartan 25 mg daily on 2/7 and plan to restart home metoprolol today.   *Restart home metoprolol *Continue losartan  # Hypothyroidism: Continue levothyroxine 125 mcg every morning  # Severe arthritis: Patient with severe arthritis per granddaughter and has been on chronic prednisone 10 mg daily. Will continue prednisone 10 mg daily as it is chronic medication.  Diet: Regular DVT Ppx: Therapeutic heparin, eliquis Dispo: Anticipated discharge in approximately 1 days  Pauline Aus, Medical Student 11/24/2019, 6:28 AM

## 2019-11-24 NOTE — Care Management (Signed)
CM text paged attending group and made aware that Eliquis requires PA

## 2019-11-24 NOTE — Care Management (Signed)
Per  [employer]. W/[facility] Co-pay amount for Eliquis 27m. twice a day. for a 30 day supply retail $124.48, 90 day supply mail order  Eliquis 5 mg. twice a day $300.00.  PA required _[phone] Deductible (out of pocket not met.) Tier 2  Retail Pharmacy : [facility]  Mail order pharmacy: [facility]

## 2019-11-24 NOTE — Evaluation (Signed)
Physical Therapy Evaluation Patient Details Name: Catherine Hess MRN: [id number] DOB: 01/21/1925 Today's Date: 11/24/2019   History of Present Illness  86 year old female with past medical history significant for hypertension, hypothyroidism, dementia, history of GI bleed in June 2020 who presented on 11/22/2019 with worsening leg swelling and shortness of breath. She was found to have DVT and PE.    Clinical Impression  Pt admitted with above diagnosis. PTA pt lived at home with family using a rollator for mobility. On eval, she required min assist bed mobility, min assist transfers and min assist ambulation 61' with RW. Pt mobilized on RA with SpO2 > 90%. Continuous cues needed to stay on task. Pt perseverating on finding her shoes and discharging home today. Pt currently with functional limitations due to the deficits listed below (see PT Problem List). Pt will benefit from skilled PT to increase their independence and safety with mobility to allow discharge to the venue listed below.    SATURATION QUALIFICATIONS: (This note is used to comply with regulatory documentation for home oxygen)  Patient Saturations on Room Air at Rest = 93%  Patient Saturations on Room Air while Ambulating = 91%     Follow Up Recommendations Home health PT;Supervision/Assistance - 24 hour    Equipment Recommendations  None recommended by PT    Recommendations for Other Services       Precautions / Restrictions Precautions Precautions: Fall Restrictions Weight Bearing Restrictions: No      Mobility  Bed Mobility Overal bed mobility: Needs Assistance Bed Mobility: Supine to Sit;Sit to Supine     Supine to sit: Min assist;HOB elevated Sit to supine: Min assist;HOB elevated   General bed mobility comments: +rail, cues for sequencing  Transfers Overall transfer level: Needs assistance Equipment used: Rolling walker (2 wheeled) Transfers: Sit to/from Stand Sit to Stand: Min assist          General transfer comment: cues for hand placement  Ambulation/Gait Ambulation/Gait assistance: Min assist Gait Distance (Feet): 80 Feet Assistive device: Rolling walker (2 wheeled) Gait Pattern/deviations: Step-through pattern;Decreased stride length Gait velocity: decreased Gait velocity interpretation: <1.31 ft/sec, indicative of household ambulator General Gait Details: Ambulated on RA with SpO2 91-95%.  Stairs            Wheelchair Mobility    Modified Rankin (Stroke Patients Only)       Balance Overall balance assessment: Needs assistance Sitting-balance support: No upper extremity supported;Feet supported Sitting balance-Leahy Scale: Fair     Standing balance support: Bilateral upper extremity supported;During functional activity Standing balance-Leahy Scale: Poor Standing balance comment: reliant on external support                             Pertinent Vitals/Pain Pain Assessment: Faces Faces Pain Scale: Hurts a little bit Pain Location: BLE during gait Pain Descriptors / Indicators: Discomfort;Sore Pain Intervention(s): Monitored during session;Repositioned    Home Living Family/patient expects to be discharged to:: Private residence Living Arrangements: Children Available Help at Discharge: Family;Available 24 hours/day Type of Home: House Home Access: Stairs to enter Entrance Stairs-Rails: None Entrance Stairs-Number of Steps: 3 Home Layout: One level Home Equipment: Walker - 4 wheels;Walker - 2 wheels      Prior Function Level of Independence: Needs assistance   Gait / Transfers Assistance Needed: rollator for mobility. Family assist. Assist for stair management.  ADL's / Homemaking Assistance Needed: assisted for meals, housekeeping and showering  Comments: Pt is a  poor historian. History taken from previous therapy eval (June 2020).     Hand Dominance   Dominant Hand: Right    Extremity/Trunk Assessment   Upper  Extremity Assessment Upper Extremity Assessment: Defer to OT evaluation    Lower Extremity Assessment Lower Extremity Assessment: Generalized weakness    Cervical / Trunk Assessment Cervical / Trunk Assessment: Kyphotic  Communication   Communication: HOH  Cognition Arousal/Alertness: Awake/alert Behavior During Therapy: Anxious Overall Cognitive Status: No family/caregiver present to determine baseline cognitive functioning                                 General Comments: h/o dementia. Pt perseverating on finding her shoes/clothes and discharging home today. Following simple commands. Verbal/tactile cues needed. Easily distracted.      General Comments      Exercises     Assessment/Plan    PT Assessment Patient needs continued PT services  PT Problem List Decreased strength;Decreased mobility;Decreased safety awareness;Decreased knowledge of precautions;Decreased activity tolerance;Cardiopulmonary status limiting activity;Decreased balance;Pain       PT Treatment Interventions DME instruction;Therapeutic activities;Gait training;Therapeutic exercise;Patient/family education;Balance training;Stair training;Functional mobility training    PT Goals (Current goals can be found in the Care Plan section)  Acute Rehab PT Goals Patient Stated Goal: home PT Goal Formulation: With patient Time For Goal Achievement: 12/08/19 Potential to Achieve Goals: Good    Frequency Min 3X/week   Barriers to discharge        Co-evaluation               AM-PAC PT "6 Clicks" Mobility  Outcome Measure Help needed turning from your back to your side while in a flat bed without using bedrails?: A Little Help needed moving from lying on your back to sitting on the side of a flat bed without using bedrails?: A Little Help needed moving to and from a bed to a chair (including a wheelchair)?: A Little Help needed standing up from a chair using your arms (e.g., wheelchair  or bedside chair)?: A Little Help needed to walk in hospital room?: A Little Help needed climbing 3-5 steps with a railing? : A Lot 6 Click Score: 17    End of Session Equipment Utilized During Treatment: Gait belt Activity Tolerance: Patient tolerated treatment well Patient left: in bed;with call bell/phone within reach;with bed alarm set Nurse Communication: Mobility status PT Visit Diagnosis: Unsteadiness on feet (R26.81);Muscle weakness (generalized) (M62.81)    Time: 3614-4315 PT Time Calculation (min) (ACUTE ONLY): 25 min   Charges:   PT Evaluation $PT Eval Moderate Complexity: 1 Mod PT Treatments $Gait Training: 8-22 mins        Lorrin Goodell, PT  Office # [phone] Pager [phone]   Lorriane Shire 11/24/2019, 11:27 AM

## 2019-11-24 NOTE — Progress Notes (Signed)
Rec'd page about pt having brief chest discomfort.  Evaluated pt at bedside she reports around 5 minutes of RLQ cramping type pain that radiated up her right side that resolved spontaneously.  She denies any chest pain or shortness of breath.  Her main concern is her nose drying out and feeling stuffed up.  Her vitals are stable, ecg unchanged without signs of acs, physical exam reassuring with soft non-tender abdomen, RRR, CTAB.  During our 20 minute discussion I d/ced her supplemental oxygen and she maintained O2 saturation of around 94%.  Low suspicion for ACS or worsening PE  Plan:  D/c supplemental oxygen Saline nasal spray Start miralax Continue to monitor, if needed could add gas x or bentyl

## 2019-11-24 NOTE — Progress Notes (Signed)
SATURATION QUALIFICATIONS: (This note is used to comply with regulatory documentation for home oxygen) ? ?Patient Saturations on Room Air at Rest = 93% ? ?Patient Saturations on Room Air while Ambulating = 91% ? ? ?

## 2019-11-25 DIAGNOSIS — Z881 Allergy status to other antibiotic agents status: Secondary | ICD-10-CM

## 2019-11-25 LAB — CBC
HCT: 40.9 % (ref 36.0–46.0)
Hemoglobin: 13.2 g/dL (ref 12.0–15.0)
MCH: 31.7 pg (ref 26.0–34.0)
MCHC: 32.3 g/dL (ref 30.0–36.0)
MCV: 98.3 fL (ref 80.0–100.0)
Platelets: 208 10*3/uL (ref 150–400)
RBC: 4.16 MIL/uL (ref 3.87–5.11)
RDW: 13.2 % (ref 11.5–15.5)
WBC: 7.3 10*3/uL (ref 4.0–10.5)
nRBC: 0 % (ref 0.0–0.2)

## 2019-11-25 MED ORDER — CALCIUM CARBONATE 600 MG PO TABS: 600.0000 mg | ORAL_TABLET | Freq: Every day | ORAL | Status: DC

## 2019-11-25 MED ORDER — APIXABAN 5 MG PO TABS: 5.0000 mg | ORAL_TABLET | Freq: Two times a day (BID) | ORAL | 3 refills | Status: DC

## 2019-11-25 MED ORDER — APIXABAN (ELIQUIS) EDUCATION KIT FOR DVT/PE PATIENTS
PACK | Freq: Once | Status: AC
  Filled 2019-11-25: qty 1

## 2019-11-25 MED ORDER — APIXABAN 5 MG PO TABS: 10.0000 mg | ORAL_TABLET | Freq: Two times a day (BID) | ORAL | 0 refills | Status: DC

## 2019-11-25 MED ORDER — METOPROLOL TARTRATE 12.5 MG HALF TABLET
12.5000 mg | ORAL_TABLET | Freq: Two times a day (BID) | ORAL | Status: DC
  Administered 2019-11-25: 12.5 mg via ORAL
  Filled 2019-11-25: qty 1

## 2019-11-25 MED FILL — ELIQUIS 5 MG TABLET: 5 | 25 days supply | Qty: 60 | Fill #0

## 2019-11-25 NOTE — Progress Notes (Signed)
Prior approval for Eliquis submitted and approved.  EOC ID: [id number] External case number: [id number]  Status: Approved  Katherine Roan, MD 11/25/2019, 11:19 AM

## 2019-11-25 NOTE — Progress Notes (Signed)
Subjective:   Catherine Hess was lying comfortably in bed this morning during the interview. She denies difficulty breathing or pain.  She requested that her treatment team call her granddaughter to discuss discharge plans.  Phone conversation with granddaughter, Catherine Hess [phone]).  Informed Catherine Hess that patient is medically ready for discharge today.  Per conversation with pharmacy, advised that patient discontinue ginkgo balboa as this can result in increased bleeding risk when combined with Eliquis.  Patient's granddaughter voiced understanding.   Objective:    Vital Signs (last 24 hours): Vitals:   11/24/19 1600 11/24/19 2031 11/24/19 2309 11/25/19 0304  BP:  (!) 146/74 (!) 161/84 (!) 164/85  Pulse: 87 76 78   Resp: 11 18 18    Temp:  98.1 F (36.7 C) 98 F (36.7 C) 98 F (36.7 C)  TempSrc:  Oral Oral Oral  SpO2: 92% 90% 91% 91%  Weight:    59.2 kg    Physical Exam: General Alert and answers questions appropriately, no acute distress  Cardiac Regular rate and rhythm, no murmurs, rubs, or gallops  Pulmonary Clear to auscultation bilaterally without wheezes, rhonchi, or rales  Extremities             L LE non-pitting edema.     BMP Latest Ref Rng & Units 11/23/2019 11/22/2019 11/21/2019  Glucose 70 - 99 mg/dL 01/19/2020) 195(K) 932(I)  BUN 8 - 23 mg/dL 10 13 15   Creatinine 0.44 - 1.00 mg/dL 712(W 5.80  Sodium 135 - 145 mmol/L 141 138 141  Potassium 3.5 - 5.1 mmol/L 3.8 3.7 3.9  Chloride 98 - 111 mmol/L 109 107 101  CO2 22 - 32 mmol/L 23 23 27   Calcium 8.9 - 10.3 mg/dL 9.3 9.6 9.98   CBC Latest Ref Rng & Units 11/25/2019 11/24/2019 11/23/2019  WBC 4.0 - 10.5 K/uL 7.3 7.4 7.0  Hemoglobin 12.0 - 15.0 g/dL 01/23/2020 01/22/2020 01/21/2020  Hematocrit 36.0 - 46.0 % 40.9 38.3 40.6  Platelets 150 - 400 K/uL 208 178 172    Assessment/Plan:   Active Problems:   Saddle pulmonary embolus (HCC)   Leg DVT (deep venous thromboembolism), acute, left [facility])  Patient is a 86 year old female with past  medical history significant for hypertension, hypothyroidism, dementia, diverticulosis (per 6/22 discharge summary), of GI bleed in June 2020 who presented on 11/22/2019 with worsening leg swelling and shortness of breath.  The patient was evaluated at her pcp's office and there was concern for dvt which prompted patient to come to [facility] for evaluation on 2/5.  Admitted for unprovoked acute saddle pulmonary embolism.    # Unprovoked acute saddle pulmonary embolism with right heart strain: Patient saturating well on room air, hemodynamically stable.  H/H wnl and patient stable on RA with ambulation. HAS-BLED score 2 (hypertension, age>65) puts her at moderate risk for major bleed.  Discussed risks and benefits with family re: anticoagulation treatment given past GI bleed, and transitioned from IV heparin to Eliquis on 2/7.  Patient remains stable today and is safe for discharge home with [facility] PT.   *Continue Eliquis 10 mg BID for 7 days (complete on 2/13), then 5 mg BID *Only risk factor was being sedentary, will need at least 6 months of anticoagulation, possibly indefinitely.  Discuss with PCP *PT recommended HH PT with 24 hour supervision/assistance -- order has been placed *Avoid ginkgo in setting of starting Eliquis.  May consider Vit B12 and folic acid for memory instead of gingko.   # Hypertension: Home losartan and metoprolol  were initially held in setting of large PE with concerns for development to hemodynamic instability. With persistent elevated blood pressure, restarted home losartan 25 mg daily on 2/7 and metoprolol 2/9 *Continue home losartan and metoprolol  # Hypothyroidism: Continue levothyroxine 125 mcg every morning  # Severe arthritis: Patient with severe arthritis per granddaughter and has been on chronic prednisone 10 mg daily. Will continue prednisone 10 mg daily as it is chronic medication.  Diet: Regular DVT Ppx: eliquis Dispo: Anticipated discharge today  Johny Blamer,  Medical Student 11/25/2019, 7:31 AM

## 2019-11-25 NOTE — Progress Notes (Signed)
Physical Therapy Treatment Patient Details Name: Catherine Hess MRN: [id number] DOB: January 01, 1925 Today's Date: 11/25/2019    History of Present Illness Pt is a 86 y.o. female with PMH of HTN, hypothyroidism, dementia, GIB, who presented on 11/22/2019 with worsening leg swelling and shortness of breath. She was found to have DVT and PE.   PT Comments    Pt progressing with mobility. Preparing to d/c home today and focused on wanting to eat meal. Mod indep with bed mobility and sitting balance. VSS on RA. Educ re: therex/activity recommendations and fall risk reduction; pt with poor teach back. Reports owning necessary DME and will have 24/7 family support. Recommend follow-up with HHPT services to maximize functional mobility and independence.   Follow Up Recommendations  Home health PT;Supervision/Assistance - 24 hour     Equipment Recommendations  None recommended by PT    Recommendations for Other Services       Precautions / Restrictions Precautions Precautions: Fall Restrictions Weight Bearing Restrictions: No    Mobility  Bed Mobility Overal bed mobility: Independent             General bed mobility comments: Independent for supine<>sit, also able to come to long sitting with bed flat and scoot up towards [facility] without physical assist; cues to attend to task  Transfers                 General transfer comment: Declined secondary to wanting to eat  Ambulation/Gait                 Stairs             Wheelchair Mobility    Modified Rankin (Stroke Patients Only)       Balance Overall balance assessment: Needs assistance   Sitting balance-Leahy Scale: Fair                                      Cognition Arousal/Alertness: Awake/alert Behavior During Therapy: WFL for tasks assessed/performed Overall Cognitive Status: No family/caregiver present to determine baseline cognitive functioning                                  General Comments: H/o dementia. Perseverating on needing to eat breakfast, difficult to redirect from this. Following majority of simple commands, poor attention and difficulty multitasking      Exercises      General Comments General comments (skin integrity, edema, etc.): Educ re: exercise/activity recommendations, fall risk reduction - pt distracted with poor teach back. Pt requesting assist for all aspects of meal set up, when encouraged to open packets and items herself able to do so with intermittent minA      Pertinent Vitals/Pain Pain Assessment: No/denies pain    Home Living                      Prior Function            PT Goals (current goals can now be found in the care plan section) Progress towards PT goals: Progressing toward goals    Frequency    Min 3X/week      PT Plan Current plan remains appropriate    Co-evaluation              AM-PAC PT "6 Clicks" Mobility   Outcome Measure  Help needed turning from your back to your side while in a flat bed without using bedrails?: None Help needed moving from lying on your back to sitting on the side of a flat bed without using bedrails?: None Help needed moving to and from a bed to a chair (including a wheelchair)?: A Little Help needed standing up from a chair using your arms (e.g., wheelchair or bedside chair)?: A Little Help needed to walk in hospital room?: A Little Help needed climbing 3-5 steps with a railing? : A Lot 6 Click Score: 19    End of Session   Activity Tolerance: Patient tolerated treatment well Patient left: in bed;with call bell/phone within reach;with bed alarm set Nurse Communication: Mobility status PT Visit Diagnosis: Unsteadiness on feet (R26.81);Muscle weakness (generalized) (M62.81)     Time: 5369-2230 PT Time Calculation (min) (ACUTE ONLY): 17 min  Charges:  $Therapeutic Activity: 8-22 mins                    Ina Homes, PT, DPT Acute  Rehabilitation Services  Pager [phone] Office [phone]  Malachy Chamber 11/25/2019, 1:13 PM

## 2019-11-25 NOTE — TOC Transition Note (Signed)
Transition of Care [facility]) - CM/SW Discharge Note   Patient Details  Name: Catherine Hess MRN: [id number] Date of Birth: Apr 06, 1925  [facility] CM/SW Contact:  Lawerance Sabal, RN Phone Number: 11/25/2019, 10:39 AM   Clinical Narrative:   Sherron Monday w daughter and granddaughter. They will provide transportation home today after 3pm. Verified w [employer] they will accept, and SOC will likely be tomorrow.  Texted granddaughter Eliquis card. Confirmed w Resident team that they will do prior auth prior to DC.     Final next level of care: Home w Home Health Services Barriers to Discharge: No Barriers Identified   Patient Goals and CMS Choice Patient states their goals for this hospitalization and ongoing recovery are:: to go home CMS Medicare.gov Compare Post Acute Care list provided to:: Other (Comment Required) Choice offered to / list presented to : Adult Children  Discharge Placement                       Discharge Plan and Services                          HH Arranged: PT HH Agency: [facility] Date [facility] Agency Contacted: 11/25/19 Time HH Agency Contacted: 1034 Representative spoke with at [facility] Agency: Baird Lyons  Social Determinants of Health (SDOH) Interventions     Readmission Risk Interventions No flowsheet data found.

## 2019-11-25 NOTE — Care Management (Signed)
Notified granddaughter that Eliquis has been filled through [facility] pharmacy and it will go home with her.

## 2019-11-25 NOTE — Discharge Summary (Addendum)
Name: Catherine Hess MRN: [id number] DOB: 1925-02-18 86 y.o. PCP: Beola Cord, FNP  Date of Admission: 11/22/2019 11:02 AM Date of Discharge: 11/25/2019 Attending Physician: Axel Filler, *  Discharge Diagnosis: 1. Unprovoked acute saddle pulmonary embolism 2. Hypertension 3. Hypothyroidism 4. Severe arthritis 5. Deconditioning  Discharge Medications: Allergies as of 11/25/2019       Reactions   Azithromycin Swelling        Medication List     STOP taking these medications    cephALEXin 500 MG capsule Commonly known as: KEFLEX   Ginkgo Biloba Extract 60 MG Caps       TAKE these medications    acetaminophen 650 MG CR tablet Commonly known as: TYLENOL Take 650 mg by mouth every 8 (eight) hours as needed for pain.   apixaban 5 MG Tabs tablet Commonly known as: ELIQUIS Take 2 tablets (10 mg total) by mouth 2 (two) times daily for 5 days.   apixaban 5 MG Tabs tablet Commonly known as: ELIQUIS Take 1 tablet (5 mg total) by mouth 2 (two) times daily. Start taking on: November 30, 2019   CALCIUM 600-D PO Take 1 tablet by mouth at bedtime.   calcium carbonate 600 MG Tabs tablet Commonly known as: Calcium 600 Take 1 tablet (600 mg total) by mouth daily with breakfast.   ELDERBERRY PO Take 2 tablets by mouth daily.   levothyroxine 125 MCG tablet Commonly known as: SYNTHROID Take 125 mcg by mouth daily before breakfast.   loratadine 10 MG tablet Commonly known as: CLARITIN Take 10 mg by mouth daily.   losartan 50 MG tablet Commonly known as: COZAAR Take 25 mg by mouth daily.   metoprolol tartrate 25 MG tablet Commonly known as: LOPRESSOR Take 12.5 mg by mouth 2 (two) times daily.   multivitamin with minerals Tabs tablet Take 1 tablet by mouth daily.   omeprazole 20 MG tablet Commonly known as: PRILOSEC OTC Take 20 mg by mouth daily.   predniSONE 10 MG tablet Commonly known as: DELTASONE Take 10 mg by mouth daily.   REFRESH  TEARS OP Place 1 drop into both eyes 4 (four) times daily.   simvastatin 20 MG tablet Commonly known as: ZOCOR Take 20 mg by mouth at bedtime.   vitamin C 1000 MG tablet Take 1,000 mg by mouth daily.   Voltaren 1 % Gel Generic drug: diclofenac Sodium Apply 1 application topically daily as needed (pain).        Disposition and follow-up:   Ms.Jobina EVELEEN MCNEAR was discharged from [facility] in Stable condition.  At the hospital follow up visit please address:  1.  Prior approval was placed at discharge for patient (for Eliquis) and patient provided with 30 day supply. Please ensure patient has continued access to this medication. Please also evaluate for signs of bleeding. Patient was advised to stop taking Ginkgo at discharge as this could increase risk for bleeding with Eliquis.  2.  Labs / imaging needed at time of follow-up: None  3.  Pending labs/ test needing follow-up: None  Follow-up Appointments:    Hospital Course by problem list:   # Unprovoked acute saddle pulmonary embolism: Patient is a 86 year old female with past medical history significant for hypertension, hypothyroidism, dementia, diverticulosis (per 6/22 discharge summary), GI bleed in June 2020 who presented on 11/22/2019 with worsening leg swelling and shortness of breath.  Outpatient lower extremity ultrasound performed on 2/6 revealed acute DVT in the left femoral, popliteal, posterior  tibial, peroneal, and gastrocnemius veins.  With patient's dyspnea, she was sent for further evaluation to the ER. CT angiogram revealed a large linear saddle pulmonary embolism extending into the right and left pulmonary arteries and branches.  Evidence for right heart strain with RV/LV ratio of 1.    HAS-BLED score 2 (hypertension, age>65) puts her at moderate risk for major bleed. Discussed risks and benefits with family regarding anticoagulation given past GI bleed, and transitioned from IV heparin to Eliquis  on 2/7. Of note, GI bleed in June 2020 was a small hemoglobin drop from 13.6 to 12.4. During hospitalization, patient remained hemodynamically stable and H/H WNL.  At discharge, she maintained O2 sats with ambulation and is safe for discharge with home health PT.  She will continue continue Eliquis 10 mg BID for 7 days (complete on 2/13), then 5 mg BID.  As this was an unprovoked PE with only risk factor of being sedentary, will need at least 6 months of anticoagulation, possibly indefinitely.  Per discussion with pharmacy, advised patient to avoid ginkgo in setting of starting Eliquis.  May consider Vit B12 and folic acid for memory instead of gingko. Prior authorization was submitted for Eliquis and patient was supplied with coupon for 30 day supply.   # Hypertension: Home losartan and metoprolol were initially held in setting of large PE with concerns for development to hemodynamic instability. With persistent elevated blood pressure, restarted home losartan 25 mg daily on 2/7 and metoprolol 2/9 *Continue home losartan and metoprolol  Discharge Vitals:   BP (!) 164/85   Pulse 74   Temp (!) 97.5 F (36.4 C) (Oral)   Resp 20   Wt 59.2 kg   SpO2 92%   Pertinent Labs, Studies, and Procedures:  CBC Latest Ref Rng & Units 11/25/2019 11/24/2019 11/23/2019  WBC 4.0 - 10.5 K/uL 7.3 7.4 7.0  Hemoglobin 12.0 - 15.0 g/dL 47.4 25.9 56.3  Hematocrit 36.0 - 46.0 % 40.9 38.3 40.6  Platelets 150 - 400 K/uL 208 178 172   CMP Latest Ref Rng & Units 11/23/2019 11/22/2019 11/21/2019  Glucose 70 - 99 mg/dL 875(I) 433(I) 951(O)  BUN 8 - 23 mg/dL 10 13 15   Creatinine 0.44 - 1.00 mg/dL 8.41 6.60  Sodium 135 - 145 mmol/L 141 138 141  Potassium 3.5 - 5.1 mmol/L 3.8 3.7 3.9  Chloride 98 - 111 mmol/L 109 107 101  CO2 22 - 32 mmol/L 23 23 27   Calcium 8.9 - 10.3 mg/dL 9.3 9.6 6.30  Total Protein 6.5 - 8.1 g/dL - 5.3(L) -  Total Bilirubin 0.3 - 1.2 mg/dL - ) -  Alkaline Phos 38 - 126 U/L - 80 -  AST 15 - 41 U/L -  22 -  ALT 0 - 44 U/L - 22 -   Imaging/studies:  CXR:  no pleural effusion or consolidation   CTA chest 2/6 Large linear saddle pulmonary embolus is noted which extends into both the right and left pulmonary arteries and their peripheral branches. RV/LV ratio is 1.0 suggesting right heart strain. Positive for acute PE with CT evidence of right heart strain (RV/LV Ratio = 1.0) consistent with at least submassive (intermediate risk) PE. The presence of right heart strain has been associated with an increased risk of morbidity and mortality.    Vas LE DVT RIGHT:  No evidence of common femoral vein obstruction.     LEFT:  Findings consistent with acute deep vein thrombosis involving the left  femoral vein, left popliteal  vein, left posterior tibial veins, left  peroneal veins, and left gastrocnemius veins.   Discharge Instructions: Discharge Instructions     Diet - low sodium heart healthy   Complete by: As directed    Increase activity slowly   Complete by: As directed        Signed: Katherine Roan, MD 11/25/2019, 4:22 PM

## 2020-06-06 ENCOUNTER — Emergency Department (HOSPITAL_COMMUNITY): Payer: Medicare Other

## 2020-06-06 ENCOUNTER — Other Ambulatory Visit: Payer: Self-pay

## 2020-06-06 ENCOUNTER — Encounter (HOSPITAL_COMMUNITY): Payer: Self-pay

## 2020-06-06 ENCOUNTER — Inpatient Hospital Stay (HOSPITAL_COMMUNITY)
Admission: EM | Admit: 2020-06-06 | Discharge: 2020-06-10 | DRG: 176 | Disposition: A | Discharge status: Skilled Nursing Facility | Payer: Medicare Other | Attending: Internal Medicine | Admitting: Internal Medicine

## 2020-06-06 DIAGNOSIS — I82531 Chronic embolism and thrombosis of right popliteal vein: Secondary | ICD-10-CM | POA: Diagnosis present

## 2020-06-06 DIAGNOSIS — M1990 Unspecified osteoarthritis, unspecified site: Secondary | ICD-10-CM | POA: Diagnosis present

## 2020-06-06 DIAGNOSIS — F0390 Unspecified dementia without behavioral disturbance: Secondary | ICD-10-CM | POA: Diagnosis present

## 2020-06-06 DIAGNOSIS — Z79890 Hormone replacement therapy (postmenopausal): Secondary | ICD-10-CM

## 2020-06-06 DIAGNOSIS — E209 Hypoparathyroidism, unspecified: Secondary | ICD-10-CM | POA: Diagnosis present

## 2020-06-06 DIAGNOSIS — Z79899 Other long term (current) drug therapy: Secondary | ICD-10-CM

## 2020-06-06 DIAGNOSIS — I10 Essential (primary) hypertension: Secondary | ICD-10-CM | POA: Diagnosis present

## 2020-06-06 DIAGNOSIS — Z86711 Personal history of pulmonary embolism: Secondary | ICD-10-CM

## 2020-06-06 DIAGNOSIS — I2699 Other pulmonary embolism without acute cor pulmonale: Secondary | ICD-10-CM | POA: Diagnosis not present

## 2020-06-06 DIAGNOSIS — Z8719 Personal history of other diseases of the digestive system: Secondary | ICD-10-CM

## 2020-06-06 DIAGNOSIS — R7989 Other specified abnormal findings of blood chemistry: Secondary | ICD-10-CM | POA: Diagnosis present

## 2020-06-06 DIAGNOSIS — E039 Hypothyroidism, unspecified: Secondary | ICD-10-CM | POA: Diagnosis present

## 2020-06-06 DIAGNOSIS — I82553 Chronic embolism and thrombosis of peroneal vein, bilateral: Secondary | ICD-10-CM | POA: Diagnosis present

## 2020-06-06 DIAGNOSIS — R600 Localized edema: Secondary | ICD-10-CM

## 2020-06-06 DIAGNOSIS — I82511 Chronic embolism and thrombosis of right femoral vein: Secondary | ICD-10-CM | POA: Diagnosis present

## 2020-06-06 DIAGNOSIS — M7122 Synovial cyst of popliteal space [Baker], left knee: Secondary | ICD-10-CM | POA: Diagnosis present

## 2020-06-06 DIAGNOSIS — Z7952 Long term (current) use of systemic steroids: Secondary | ICD-10-CM

## 2020-06-06 DIAGNOSIS — Z20822 Contact with and (suspected) exposure to covid-19: Secondary | ICD-10-CM | POA: Diagnosis present

## 2020-06-06 DIAGNOSIS — I82543 Chronic embolism and thrombosis of tibial vein, bilateral: Secondary | ICD-10-CM | POA: Diagnosis present

## 2020-06-06 DIAGNOSIS — Z881 Allergy status to other antibiotic agents status: Secondary | ICD-10-CM

## 2020-06-06 LAB — CBC
HCT: 40.1 % (ref 36.0–46.0)
Hemoglobin: 12.7 g/dL (ref 12.0–15.0)
MCH: 31.9 pg (ref 26.0–34.0)
MCHC: 31.7 g/dL (ref 30.0–36.0)
MCV: 100.8 fL — ABNORMAL HIGH (ref 80.0–100.0)
Platelets: 202 10*3/uL (ref 150–400)
RBC: 3.98 MIL/uL (ref 3.87–5.11)
RDW: 14.5 % (ref 11.5–15.5)
WBC: 8.8 10*3/uL (ref 4.0–10.5)
nRBC: 0 % (ref 0.0–0.2)

## 2020-06-06 LAB — BASIC METABOLIC PANEL
Anion gap: 10 (ref 5–15)
BUN: 12 mg/dL (ref 8–23)
CO2: 24 mmol/L (ref 22–32)
Calcium: 9.9 mg/dL (ref 8.9–10.3)
Chloride: 103 mmol/L (ref 98–111)
Creatinine, Ser: 0.79 mg/dL (ref 0.44–1.00)
GFR calc Af Amer: >60 mL/min (ref >60)
GFR calc non Af Amer: >60 mL/min (ref >60)
Glucose, Bld: 117 mg/dL — ABNORMAL HIGH (ref 70–99)
Potassium: 4.5 mmol/L (ref 3.5–5.1)
Sodium: 137 mmol/L (ref 135–145)

## 2020-06-06 LAB — BRAIN NATRIURETIC PEPTIDE: B Natriuretic Peptide: 261.2 pg/mL — ABNORMAL HIGH (ref 0.0–100.0)

## 2020-06-06 LAB — URINALYSIS, ROUTINE W REFLEX MICROSCOPIC
Bilirubin Urine: NEGATIVE
Glucose, UA: NEGATIVE mg/dL
Hgb urine dipstick: NEGATIVE
Ketones, ur: NEGATIVE mg/dL
Leukocytes,Ua: NEGATIVE
Nitrite: NEGATIVE
Protein, ur: NEGATIVE mg/dL
Specific Gravity, Urine: 1.01 (ref 1.005–1.030)
pH: 7 (ref 5.0–8.0)

## 2020-06-06 LAB — SARS CORONAVIRUS 2 BY RT PCR (HOSPITAL ORDER, PERFORMED IN ~~LOC~~ HOSPITAL LAB): SARS Coronavirus 2: NEGATIVE

## 2020-06-06 LAB — D-DIMER, QUANTITATIVE: D-Dimer, Quant: 3.48 ug/mL-FEU — ABNORMAL HIGH (ref 0.00–0.50)

## 2020-06-06 LAB — TROPONIN I (HIGH SENSITIVITY)
Troponin I (High Sensitivity): 14 ng/L (ref <18)
Troponin I (High Sensitivity): 15 ng/L (ref <18)

## 2020-06-06 MED ORDER — FUROSEMIDE 20 MG PO TABS
40.0000 mg | ORAL_TABLET | Freq: Once | ORAL | Status: AC
  Administered 2020-06-06: 40 mg via ORAL
  Filled 2020-06-06: qty 2

## 2020-06-06 MED ORDER — IOHEXOL 350 MG/ML SOLN
75.0000 mL | Freq: Once | INTRAVENOUS | Status: AC | PRN
  Administered 2020-06-06: 75 mL via INTRAVENOUS

## 2020-06-06 NOTE — ED Notes (Signed)
Pt transported to CT ?

## 2020-06-06 NOTE — ED Triage Notes (Signed)
Patient complains of increased edema to legs for some time and has had wheezing for a few days with exertion. Also having weakness in legs. Complains of chronic pain in legs but seems worse. Speaking complete sentences

## 2020-06-06 NOTE — ED Provider Notes (Signed)
[facility]   CSN: [id number] Arrival date & time: 06/06/20  1138     History No chief complaint on file.   Catherine Hess is a 86 y.o. female.  HPI   Patient with a significant medical history of hypertension, hypoparathyroidism, dimension, diverticulitis, GI bleed, presents to the emergency department with chief complaint of bilateral leg edema as well as wheezing with a cough.  Patient explains she has had wheezing and a cough that started a few days ago.  She denies shortness of breath, chest pain, fever, chills, abdominal pain, diarrhea.  She has had no recent sick contacts, is not Covid vaccinated.  She also explained that she has had some increased swelling of her legs bilaterally and noted the other day that she had weeping of her right leg.  She states she has chronic leg swelling but feels it has gotten worse over the last few days.  She denies any recent traumas, falls, change in medications.  Patient was admitted to the hospital on 02/06 due to unprovoked acute saddle pulmonary embolism with DVT noted in the left femoral, popliteal, posterior tibia veins.  She was instructed to take Eliquis for the next 6 months, as well as stay on metoprolol and lisinopril for blood pressure control.  Patient denies headache, fever, chills, sore throat, chest pain, shortness of breath, abdominal pain, dysuria.   Past Medical History:  Diagnosis Date  . Dementia (HCC)   . Hypertension   . Hypothyroidism     Patient Active Problem List   Diagnosis Date Noted  . Leg DVT (deep venous thromboembolism), acute, left (HCC)   . 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 Acute GI bleeding 04/05/2019  . Essential hypertension 04/05/2019  . Hypothyroidism 04/05/2019    History reviewed. No pertinent surgical history.   OB History   No obstetric history on file.     Family History  Family history unknown: Yes    Social History    Tobacco Use  . Smoking status: Never Smoker  . Smokeless tobacco: Never Used  Vaping Use  . Vaping Use: Never used  Substance Use Topics  . Alcohol use: Not Currently  . Drug use: Never    Home Medications Prior to Admission medications   Medication Sig Start Date End Date Taking? Authorizing Provider  acetaminophen (TYLENOL) 650 MG CR tablet Take 650 mg by mouth every 8 (eight) hours as needed for pain.    [provider]  apixaban (ELIQUIS) 5 MG TABS tablet Take 2 tablets (10 mg total) by mouth 2 (two) times daily for 5 days. 11/25/19 11/30/19  Katherine RoanMacLean, Matthew, MD  apixaban (ELIQUIS) 5 MG TABS tablet Take 1 tablet (5 mg total) by mouth 2 (two) times daily. 11/30/19   Katherine RoanMacLean, Matthew, MD  Ascorbic Acid (VITAMIN C) 1000 MG tablet Take 1,000 mg by mouth daily.    [provider]  Calcium Carb-Cholecalciferol (CALCIUM 600-D PO) Take 1 tablet by mouth at bedtime.    [provider]  calcium carbonate (CALCIUM 600) 600 MG TABS tablet Take 1 tablet (600 mg total) by mouth daily with breakfast. 11/25/19   Katherine RoanMacLean, Matthew, MD  Carboxymethylcellulose Sodium (REFRESH TEARS OP) Place 1 drop into both eyes 4 (four) times daily.    [provider]  diclofenac Sodium (VOLTAREN) 1 % GEL Apply 1 application topically daily as needed (pain).    [provider]  ELDERBERRY PO Take 2 tablets by mouth daily.  [provider]  levothyroxine (SYNTHROID) 125 MCG tablet Take 125 mcg by mouth daily before breakfast.  10/21/18   [provider]  loratadine (CLARITIN) 10 MG tablet Take 10 mg by mouth daily.    [provider]  losartan (COZAAR) 50 MG tablet Take 25 mg by mouth daily.  10/21/18   [provider]  metoprolol tartrate (LOPRESSOR) 25 MG tablet Take 12.5 mg by mouth 2 (two) times daily.  10/21/18   [provider]  Multiple Vitamin (MULTIVITAMIN WITH MINERALS) TABS tablet Take 1 tablet by mouth daily.    [provider]  omeprazole (PRILOSEC OTC) 20 MG tablet Take 20 mg by mouth daily.    [provider]  predniSONE (DELTASONE) 10 MG tablet Take 10 mg by mouth daily. 10/22/19   [provider]  simvastatin (ZOCOR) 20 MG tablet Take 20 mg by mouth at bedtime.  10/21/18   [provider]    Allergies    Azithromycin  Review of Systems   Review of Systems  Constitutional: Negative for chills and fever.  HENT: Negative for congestion, sore throat and trouble swallowing.   Respiratory: Positive for cough and wheezing. Negative for shortness of breath.   Cardiovascular: Negative for chest pain.  Gastrointestinal: Negative for abdominal pain, diarrhea, nausea and vomiting.  Genitourinary: Negative for dysuria, enuresis and flank pain.  Musculoskeletal: Negative for back pain and myalgias.       Bilateral leg swelling.  Skin: Negative for rash.  Neurological: Negative for dizziness and headaches.  Hematological: Does not bruise/bleed easily.    Physical Exam Updated Vital Signs BP (!) 128/93   Pulse 84   Temp 98.6 F (37 C) (Oral)   Resp (!) 22   Ht 5\' 3"  (1.6 m)   Wt 65.8 kg   SpO2 92%   BMI 25.69 kg/m   Physical Exam Vitals and nursing note reviewed.  Constitutional:      General: She is not in acute distress.    Appearance: She is not ill-appearing.  HENT:     Head: Normocephalic and atraumatic.     Nose: Congestion present. No rhinorrhea.     Mouth/Throat:     Mouth: Mucous membranes are moist.     Pharynx: Oropharynx is clear. No oropharyngeal exudate or posterior oropharyngeal erythema.  Eyes:     General: No scleral icterus. Cardiovascular:     Rate and Rhythm: Normal rate and regular rhythm.     Pulses: Normal pulses.     Heart sounds: No murmur heard.  No friction rub. No gallop.   Pulmonary:     Effort: No respiratory distress.     Breath sounds: No wheezing, rhonchi or rales.     Comments: Chest wall was evaluated, no gross  abnormalities noted.  Good rise and fall, no flail chest noted.  Patient had decreased respirations noted bilateral lower lobes.  No wheezing, rhonchi, rales heard. Abdominal:     General: There is no distension.     Palpations: Abdomen is soft.     Tenderness: There is no abdominal tenderness. There is no right CVA tenderness, left CVA tenderness or guarding.  Musculoskeletal:        General: No swelling.     Comments: Patient's legs were visualized, marked edema noted bilaterally, 2+ up to the shins.  Patient had good pedal pulses, good capillary refill, no lesions or other gross abnormalities noted.  Skin:    General: Skin is warm and dry.  Capillary Refill: Capillary refill takes less than 2 seconds.     Findings: No rash.  Neurological:     Mental Status: She is alert and oriented to person, place, and time.  Psychiatric:        Mood and Affect: Mood normal.     ED Results / Procedures / Treatments   Labs (all labs ordered are listed, but only abnormal results are displayed) Labs Reviewed  BASIC METABOLIC PANEL - Abnormal; Notable for the following components:      Result Value   Glucose, Bld 117 (*)    All other components within normal limits  CBC - Abnormal; Notable for the following components:   MCV 100.8 (*)    All other components within normal limits  BRAIN NATRIURETIC PEPTIDE - Abnormal; Notable for the following components:   B Natriuretic Peptide 261.2 (*)    All other components within normal limits  D-DIMER, QUANTITATIVE (NOT AT [facility]) - Abnormal; Notable for the following components:   D-Dimer, Quant 3.48 (*)    All other components within normal limits  SARS CORONAVIRUS 2 BY RT PCR (HOSPITAL ORDER, PERFORMED IN [facility])  URINALYSIS, ROUTINE W REFLEX MICROSCOPIC  TROPONIN I (HIGH SENSITIVITY)  TROPONIN I (HIGH SENSITIVITY)    EKG EKG Interpretation  Date/Time:  Sunday June 06 2020 12:38:56 EDT Ventricular Rate:  74 PR  Interval:  140 QRS Duration: 78 QT Interval:  392 QTC Calculation: 435 R Axis:   -39 Text Interpretation: Normal sinus rhythm Left axis deviation Left ventricular hypertrophy with repolarization abnormality ( R in aVL ) Possible Lateral infarct , age undetermined Abnormal ECG since last tracing no significant change Confirmed by Rolan Bucco [id number]) on 06/06/2020 1:00:41 PM   Radiology DG Chest Port 1 View  Result Date: 06/06/2020 CLINICAL DATA:  Cough. EXAM: PORTABLE CHEST 1 VIEW COMPARISON:  11/21/2019 chest radiograph and prior studies FINDINGS: Cardiomegaly and mild pulmonary vascular congestion noted. Increased LEFT LOWER lung opacity may represent airspace disease or atelectasis. Question trace bilateral pleural effusions. No pneumothorax or acute bony abnormality. IMPRESSION: 1. LEFT LOWER lung opacity which may represent atelectasis versus airspace disease/pneumonia. 2. Cardiomegaly with mild pulmonary vascular congestion and question trace bilateral pleural effusions. Electronically Signed   By: Harmon Pier M.D.   On: 06/06/2020 20:27    Procedures Procedures (including critical care time)  Medications Ordered in ED Medications  furosemide (LASIX) tablet 40 mg (has no administration in time range)    ED Course  I have reviewed the triage vital signs and the nursing notes.  Pertinent labs & imaging results that were available during my care of the patient were reviewed by me and considered in my medical decision making (see chart for details).    MDM Rules/Calculators/A&P                          I have personally reviewed all imaging, labs and have interpreted them.  Patient presents with bilateral leg swelling and wheezing.  Patient was alert and oriented, did not appear in acute distress, patient was tachypneic but O2 sats are in the mid 90s on room air, no signs of respiratory distress.  On exam patient's lung sounds were assessed, decreased respirations heard  bilaterally in the lower lobes, abdomen was nontender to palpation, patient noted to have bilateral pedal edema 2+ up to the shins, no ulcers or other gross otherwise noted, neurovascular fully intact.  Will order screening labs  and imaging for further evaluation.  Initial labs reveal BMP without electrolyte abnormalities, no signs of AKI, CBC does not show leukocytosis or signs of anemia, Covid was negative, initial troponin was 14 followed by second troponin of 15.  EKG shows sinus rhythm without signs of ischemia, chest x-ray shows left lower lung opacity which may represent atelectasis versus airspace disease like pneumonia, cardiomegaly with pulmonary vascular congestion questionable trace bilateral pleural effusions.  Will order BNP as well as D-dimer for further eval.  D-dimer significant elevated at 3.45, BMP showing 245 will continue with PE rule out with CT angiogram.  Will provide patient with Lasix for pedal edema.  Low suspicion for systemic infection as patient was nontoxic-appearing, vital signs reassuring, no obvious source of infection noted on exam, no leukocytosis on CBC.  Low suspicion for intra-abdominal abnormality requiring surgical intervention as patient's abdomen was soft soft to palpation nontender, patient is tolerating p.o. without difficulty.  Low suspicion for metabolic abnormality as BMP does not show electrolyte abnormalities, no signs of AKI.  Low suspicion for cardiac abnormality as patient denies chest pain, shortness of breath, EKG was sinus without signs of ischemia, initial troponin was 14-second troponin was 15.  Due to shift change patient will be handed off to [facility], provided HPI, current work-up, likely disposition.  Pending CT angiogram, if positive for PE patient will be admitted to hospitalist for further management.  If negative patient will be discharged home on Lovenox and instruct to follow-up tomorrow for DVT study.  Recommend providing  patient with a cardiologist as she does not have one.      Final Clinical Impression(s) / ED Diagnoses Final diagnoses:  Pedal edema    Rx / DC Orders ED Discharge Orders    None       Barnie Del 06/06/20 2338    Sabino Donovan, MD 06/07/20 [phone]

## 2020-06-07 ENCOUNTER — Encounter (HOSPITAL_COMMUNITY): Payer: Self-pay | Admitting: Internal Medicine

## 2020-06-07 ENCOUNTER — Observation Stay (HOSPITAL_COMMUNITY): Payer: Medicare Other

## 2020-06-07 DIAGNOSIS — I2699 Other pulmonary embolism without acute cor pulmonale: Secondary | ICD-10-CM | POA: Diagnosis present

## 2020-06-07 DIAGNOSIS — Z8719 Personal history of other diseases of the digestive system: Secondary | ICD-10-CM | POA: Diagnosis not present

## 2020-06-07 DIAGNOSIS — I351 Nonrheumatic aortic (valve) insufficiency: Secondary | ICD-10-CM

## 2020-06-07 DIAGNOSIS — E039 Hypothyroidism, unspecified: Secondary | ICD-10-CM | POA: Diagnosis present

## 2020-06-07 DIAGNOSIS — M7989 Other specified soft tissue disorders: Secondary | ICD-10-CM

## 2020-06-07 DIAGNOSIS — Z86718 Personal history of other venous thrombosis and embolism: Secondary | ICD-10-CM

## 2020-06-07 DIAGNOSIS — Z7952 Long term (current) use of systemic steroids: Secondary | ICD-10-CM | POA: Diagnosis not present

## 2020-06-07 DIAGNOSIS — I82553 Chronic embolism and thrombosis of peroneal vein, bilateral: Secondary | ICD-10-CM | POA: Diagnosis present

## 2020-06-07 DIAGNOSIS — Z20822 Contact with and (suspected) exposure to covid-19: Secondary | ICD-10-CM | POA: Diagnosis present

## 2020-06-07 DIAGNOSIS — M7122 Synovial cyst of popliteal space [Baker], left knee: Secondary | ICD-10-CM | POA: Diagnosis present

## 2020-06-07 DIAGNOSIS — F0390 Unspecified dementia without behavioral disturbance: Secondary | ICD-10-CM | POA: Diagnosis present

## 2020-06-07 DIAGNOSIS — I10 Essential (primary) hypertension: Secondary | ICD-10-CM | POA: Diagnosis present

## 2020-06-07 DIAGNOSIS — R7989 Other specified abnormal findings of blood chemistry: Secondary | ICD-10-CM | POA: Diagnosis present

## 2020-06-07 DIAGNOSIS — I82543 Chronic embolism and thrombosis of tibial vein, bilateral: Secondary | ICD-10-CM | POA: Diagnosis present

## 2020-06-07 DIAGNOSIS — I82531 Chronic embolism and thrombosis of right popliteal vein: Secondary | ICD-10-CM | POA: Diagnosis present

## 2020-06-07 DIAGNOSIS — Z86711 Personal history of pulmonary embolism: Secondary | ICD-10-CM | POA: Diagnosis not present

## 2020-06-07 DIAGNOSIS — Z79890 Hormone replacement therapy (postmenopausal): Secondary | ICD-10-CM | POA: Diagnosis not present

## 2020-06-07 DIAGNOSIS — E209 Hypoparathyroidism, unspecified: Secondary | ICD-10-CM | POA: Diagnosis present

## 2020-06-07 DIAGNOSIS — I82511 Chronic embolism and thrombosis of right femoral vein: Secondary | ICD-10-CM | POA: Diagnosis present

## 2020-06-07 DIAGNOSIS — Z881 Allergy status to other antibiotic agents status: Secondary | ICD-10-CM | POA: Diagnosis not present

## 2020-06-07 DIAGNOSIS — Z79899 Other long term (current) drug therapy: Secondary | ICD-10-CM | POA: Diagnosis not present

## 2020-06-07 DIAGNOSIS — M1990 Unspecified osteoarthritis, unspecified site: Secondary | ICD-10-CM | POA: Diagnosis present

## 2020-06-07 LAB — BASIC METABOLIC PANEL
Anion gap: 9 (ref 5–15)
BUN: 12 mg/dL (ref 8–23)
CO2: 27 mmol/L (ref 22–32)
Calcium: 9.3 mg/dL (ref 8.9–10.3)
Chloride: 102 mmol/L (ref 98–111)
Creatinine, Ser: 0.8 mg/dL (ref 0.44–1.00)
GFR calc Af Amer: >60 mL/min (ref >60)
GFR calc non Af Amer: >60 mL/min (ref >60)
Glucose, Bld: 124 mg/dL — ABNORMAL HIGH (ref 70–99)
Potassium: 3.7 mmol/L (ref 3.5–5.1)
Sodium: 138 mmol/L (ref 135–145)

## 2020-06-07 LAB — ECHOCARDIOGRAM COMPLETE
Area-P 1/2: 1.86 cm2
Height: 63 in
P 1/2 time: 490 msec
S' Lateral: 2.14 cm
Weight: 2320 oz

## 2020-06-07 LAB — HEPARIN LEVEL (UNFRACTIONATED)
Heparin Unfractionated: 0.65 IU/mL (ref 0.30–0.70)
Heparin Unfractionated: 0.78 IU/mL — ABNORMAL HIGH (ref 0.30–0.70)

## 2020-06-07 LAB — CBC
HCT: 41.9 % (ref 36.0–46.0)
Hemoglobin: 13.5 g/dL (ref 12.0–15.0)
MCH: 32.2 pg (ref 26.0–34.0)
MCHC: 32.2 g/dL (ref 30.0–36.0)
MCV: 100 fL (ref 80.0–100.0)
Platelets: 174 10*3/uL (ref 150–400)
RBC: 4.19 MIL/uL (ref 3.87–5.11)
RDW: 14.5 % (ref 11.5–15.5)
WBC: 7.7 10*3/uL (ref 4.0–10.5)
nRBC: 0 % (ref 0.0–0.2)

## 2020-06-07 LAB — TROPONIN I (HIGH SENSITIVITY)
Troponin I (High Sensitivity): 17 ng/L (ref <18)
Troponin I (High Sensitivity): 18 ng/L — ABNORMAL HIGH (ref <18)

## 2020-06-07 MED ORDER — ADULT MULTIVITAMIN W/MINERALS CH
1.0000 | ORAL_TABLET | Freq: Every day | ORAL | Status: DC
  Administered 2020-06-07 – 2020-06-10 (×4): 1 via ORAL
  Filled 2020-06-07 (×4): qty 1

## 2020-06-07 MED ORDER — LOSARTAN POTASSIUM 25 MG PO TABS
25.0000 mg | ORAL_TABLET | Freq: Every day | ORAL | Status: DC
  Administered 2020-06-07 – 2020-06-10 (×4): 25 mg via ORAL
  Filled 2020-06-07 (×4): qty 1

## 2020-06-07 MED ORDER — VITAMIN E 45 MG (100 UNIT) PO CAPS
100.0000 [IU] | ORAL_CAPSULE | Freq: Every day | ORAL | Status: DC
  Administered 2020-06-07 – 2020-06-10 (×4): 100 [IU] via ORAL
  Filled 2020-06-07 (×4): qty 1

## 2020-06-07 MED ORDER — PANTOPRAZOLE SODIUM 40 MG PO TBEC
40.0000 mg | DELAYED_RELEASE_TABLET | Freq: Every day | ORAL | Status: DC
  Administered 2020-06-07 – 2020-06-10 (×4): 40 mg via ORAL
  Filled 2020-06-07 (×4): qty 1

## 2020-06-07 MED ORDER — HEPARIN BOLUS VIA INFUSION
4000.0000 [IU] | Freq: Once | INTRAVENOUS | Status: AC
  Administered 2020-06-07: 4000 [IU] via INTRAVENOUS
  Filled 2020-06-07: qty 4000

## 2020-06-07 MED ORDER — METOPROLOL TARTRATE 12.5 MG HALF TABLET
12.5000 mg | ORAL_TABLET | Freq: Two times a day (BID) | ORAL | Status: DC
  Administered 2020-06-07 – 2020-06-10 (×7): 12.5 mg via ORAL
  Filled 2020-06-07 (×7): qty 1

## 2020-06-07 MED ORDER — ONDANSETRON HCL 4 MG/2ML IJ SOLN: 4.0000 mg | Freq: Four times a day (QID) | INTRAMUSCULAR | Status: DC | PRN

## 2020-06-07 MED ORDER — LEVOTHYROXINE SODIUM 100 MCG PO TABS
100.0000 ug | ORAL_TABLET | Freq: Every day | ORAL | Status: DC
  Administered 2020-06-07 – 2020-06-10 (×4): 100 ug via ORAL
  Filled 2020-06-07 (×4): qty 1

## 2020-06-07 MED ORDER — ONDANSETRON HCL 4 MG PO TABS: 4.0000 mg | ORAL_TABLET | Freq: Four times a day (QID) | ORAL | Status: DC | PRN

## 2020-06-07 MED ORDER — HEPARIN (PORCINE) 25000 UT/250ML-% IV SOLN
1000.0000 [IU]/h | INTRAVENOUS | Status: AC
  Administered 2020-06-07 (×2): 1100 [IU]/h via INTRAVENOUS
  Filled 2020-06-07 (×2): qty 250

## 2020-06-07 MED ORDER — PREDNISONE 5 MG PO TABS
5.0000 mg | ORAL_TABLET | ORAL | Status: DC
  Administered 2020-06-07 – 2020-06-09 (×3): 5 mg via ORAL
  Filled 2020-06-07 (×3): qty 1

## 2020-06-07 MED ORDER — ASCORBIC ACID 500 MG PO TABS
500.0000 mg | ORAL_TABLET | Freq: Every day | ORAL | Status: DC
  Administered 2020-06-07 – 2020-06-10 (×4): 500 mg via ORAL
  Filled 2020-06-07 (×4): qty 1

## 2020-06-07 MED ORDER — CALCIUM CARBONATE-VITAMIN D 500-200 MG-UNIT PO TABS
ORAL_TABLET | Freq: Every day | ORAL | Status: DC
  Administered 2020-06-07 – 2020-06-09 (×3): 1 via ORAL
  Filled 2020-06-07 (×4): qty 1

## 2020-06-07 MED ORDER — SIMVASTATIN 20 MG PO TABS
20.0000 mg | ORAL_TABLET | Freq: Every day | ORAL | Status: DC
  Administered 2020-06-07 – 2020-06-09 (×4): 20 mg via ORAL
  Filled 2020-06-07 (×4): qty 1

## 2020-06-07 NOTE — ED Provider Notes (Addendum)
Physical Exam  BP (!) 172/90   Pulse 80   Temp 98.5 F (36.9 C) (Oral)   Resp (!) 21   Ht 5\' 3"  (1.6 m)   Wt 65.8 kg   SpO2 94%   BMI 25.69 kg/m   Physical Exam Vitals and nursing note reviewed.  Constitutional:      General: She is not in acute distress.    Appearance: She is well-developed.  HENT:     Head: Normocephalic and atraumatic.  Eyes:     Conjunctiva/sclera: Conjunctivae normal.     Pupils: Pupils are equal, round, and reactive to light.  Cardiovascular:     Rate and Rhythm: Normal rate and regular rhythm.     Pulses: Normal pulses.  Pulmonary:     Effort: Pulmonary effort is normal. No respiratory distress.     Breath sounds: Normal breath sounds. No wheezing.  Abdominal:     General: Bowel sounds are normal. There is no distension.     Palpations: Abdomen is soft.     Tenderness: There is no abdominal tenderness.  Musculoskeletal:        General: Normal range of motion.     Cervical back: Normal range of motion and neck supple.     Right lower leg: Edema present.     Left lower leg: Edema present.  Skin:    General: Skin is warm and dry.     Capillary Refill: Capillary refill takes less than 2 seconds.  Neurological:     Mental Status: She is alert and oriented to person, place, and time.     ED Course/Procedures     .Critical Care Performed by: , PA-C Authorized by: Alveria Apley, PA-C   Critical care provider statement:    Critical care time (minutes):  35   Critical care time was exclusive of:  Separately billable procedures and treating other patients and teaching time   Critical care was necessary to treat or prevent imminent or life-threatening deterioration of the following conditions:  Respiratory failure   Critical care was time spent personally by me on the following activities:  Blood draw for specimens, development of treatment plan with patient or surrogate, evaluation of patient's response to treatment,  examination of patient, obtaining history from patient or surrogate, ordering and performing treatments and interventions, ordering and review of laboratory studies, ordering and review of radiographic studies, pulse oximetry, re-evaluation of patient's condition and review of old charts   I assumed direction of critical care for this patient from another provider in my specialty: no   Comments:     Pt with bilateral PE's requiring heparin gtt and admission.     MDM   Pt signed out to me by Alveria Apley, PA-C. Please see previous notes for further history.   In brief, pt presenting for evaluation of leg swelling and wheezing. On exam, pt with bilateral lower ext edema, R leg pain. H/o saddle PE in 11/2019, was on 6 months anticoagulation and d/c'd per MD last month. Dvt/PE was unprovoked. On exam, pt stable. Labs show elevated dimer. Also, mildly elevated bnp, bilateral edema, and changes on cxr c/w CHF. Consider new onset heart failure due to recurrent PEs. Pending CTA.   CTA positive for bilateral PE's. No heart strain.heparin gtt started.  PESI score of 3, intermediate risk. Will call for admission, as pt lives far from the hospital, has recurrent PEs, and intermediate risk.   Discussed with Dr. 12/2019 from [facility], pt to  be admitted.        Alveria Apley, PA-C 06/07/20 0125    Alveria Apley, PA-C 06/07/20 0126    Marily Memos, MD 06/07/20 [phone]

## 2020-06-07 NOTE — H&P (Signed)
History and Physical    Catherine Hess:[id number] DOB: 04-May-1925 DOA: 06/06/2020  PCP: Tacy Learn, FNP  Patient coming from: Home.  Chief Complaint: Shortness of breath.  History obtained from patient's daughter.  HPI: Catherine Hess is a 86 y.o. female with history of hypertension, hypothyroidism admitted in February 2021 of this year for saddle embolus and right lower extremity DVT was on anticoagulation for 6 months and discontinued recently was found to be increasingly short of breath last few days particularly on exertion with some wheezing.  Patient also has right lower extremity swelling which patient's daughter states has not gone since her last DVT diagnosis in February 2021.  Denies any fever chills nausea vomiting or diarrhea.  ED Course: In the ER patient was hemodynamically stable.  D-dimer was elevated CT angiogram of the chest shows bilateral pulmonary embolism with no strain pattern.  BNP was 261 high sensitive troponin was negative.  ER physician felt the patient may be mildly fluid overloaded given 1 dose of Lasix 40 mg IV.  Labs otherwise are largely unremarkable.  Covid test is negative.  Patient admitted for recurrent pulmonary embolism and was started on heparin infusion at this time.  Review of Systems: As per HPI, rest all negative.   Past Medical History:  Diagnosis Date  . Dementia (HCC)   . Hypertension   . Hypothyroidism     History reviewed. No pertinent surgical history.   reports that she has never smoked. She has never used smokeless tobacco. She reports previous alcohol use. She reports that she does not use drugs.  Allergies  Allergen Reactions  . Azithromycin Swelling    Family History  Family history unknown: Yes    Prior to Admission medications   Medication Sig Start Date End Date Taking? Authorizing Provider  ascorbic acid (VITAMIN C) 500 MG tablet Take 500 mg by mouth daily.   Yes [provider]  Calcium  Carb-Cholecalciferol (CALCIUM 600-D PO) Take 1 tablet by mouth at bedtime.   Yes [provider]  Carboxymethylcellulose Sodium (REFRESH TEARS OP) Place 1 drop into both eyes 4 (four) times daily.   Yes [provider]  ELDERBERRY PO Take 2 tablets by mouth daily.   Yes [provider]  levothyroxine (SYNTHROID) 100 MCG tablet Take 100 mcg by mouth daily before breakfast.   Yes [provider]  losartan (COZAAR) 50 MG tablet Take 25 mg by mouth daily.  10/21/18  Yes [provider]  metoprolol tartrate (LOPRESSOR) 25 MG tablet Take 12.5 mg by mouth 2 (two) times daily.  10/21/18  Yes [provider]  Multiple Vitamin (MULTIVITAMIN WITH MINERALS) TABS tablet Take 1 tablet by mouth daily.   Yes [provider]  omeprazole (PRILOSEC) 40 MG capsule Take 40 mg by mouth daily.   Yes [provider]  predniSONE (DELTASONE) 10 MG tablet Take 5 mg by mouth every other day.  10/22/19  Yes [provider]  simvastatin (ZOCOR) 20 MG tablet Take 20 mg by mouth at bedtime.  10/21/18  Yes [provider]  vitamin E 45 MG (100 UNITS) capsule Take 100 Units by mouth daily.   Yes [provider]  apixaban (ELIQUIS) 5 MG TABS tablet Take 2 tablets (10 mg total) by mouth 2 (two) times daily for 5 days. Patient not taking: Reported on 06/07/2020 11/25/19 06/07/29  Katherine Roan, MD  apixaban (ELIQUIS) 5 MG TABS tablet Take 1 tablet (5 mg total) by mouth 2 (two)  times daily. Patient not taking: Reported on 06/07/2020 11/30/19   Katherine Roan, MD  calcium carbonate (CALCIUM 600) 600 MG TABS tablet Take 1 tablet (600 mg total) by mouth daily with breakfast. Patient not taking: Reported on 06/07/2020 11/25/19   Katherine Roan, MD    Physical Exam: Constitutional: Moderately built and nourished. Vitals:   06/06/20 2200 06/06/20 2230 06/07/20 0108 06/07/20 0110  BP: (!) 128/93 (!) 148/95  (!) 172/90  Pulse: 84 83  80  Resp: (!)  22 17  (!) 21  Temp:   98.5 F (36.9 C)   TempSrc:   Oral   SpO2: 92% 93%  94%  Weight:      Height:       Eyes: Anicteric no pallor. ENMT: No discharge from the ears eyes nose or mouth. Neck: No mass felt.  No neck rigidity.  No JVD appreciated. Respiratory: No rhonchi or crepitations. Cardiovascular: S1-S2 heard. Abdomen: Soft nontender bowel sounds present. Musculoskeletal: Right lower extremity swelling. Skin: No rash. Neurologic: Alert awake oriented to place and person.  Moves all extremities. Psychiatric: Oriented to place and person.   Labs on Admission: I have personally reviewed following labs and imaging studies  CBC: Recent Labs  Lab 06/06/20 1229  WBC 8.8  HGB 12.7  HCT 40.1  MCV 100.8*  PLT 202   Basic Metabolic Panel: Recent Labs  Lab 06/06/20 1229  NA 137  K 4.5  CL 103  CO2 24  GLUCOSE 117*  BUN 12  CREATININE 0.79  CALCIUM 9.9   GFR: Estimated Creatinine Clearance: 40.1 mL/min (by C-G formula based on SCr of 0.79 mg/dL). Liver Function Tests: No results for input(s): AST, ALT, ALKPHOS, BILITOT, PROT, ALBUMIN in the last 168 hours. No results for input(s): LIPASE, AMYLASE in the last 168 hours. No results for input(s): AMMONIA in the last 168 hours. Coagulation Profile: No results for input(s): INR, PROTIME in the last 168 hours. Cardiac Enzymes: No results for input(s): CKTOTAL, CKMB, CKMBINDEX, TROPONINI in the last 168 hours. BNP (last 3 results) No results for input(s): PROBNP in the last 8760 hours. HbA1C: No results for input(s): HGBA1C in the last 72 hours. CBG: No results for input(s): GLUCAP in the last 168 hours. Lipid Profile: No results for input(s): CHOL, HDL, LDLCALC, TRIG, CHOLHDL, LDLDIRECT in the last 72 hours. Thyroid Function Tests: No results for input(s): TSH, T4TOTAL, FREET4, T3FREE, THYROIDAB in the last 72 hours. Anemia Panel: No results for input(s): VITAMINB12, FOLATE, FERRITIN, TIBC, IRON, RETICCTPCT in the  last 72 hours. Urine analysis:    Component Value Date/Time   COLORURINE YELLOW 06/06/2020 1227   APPEARANCEUR CLEAR 06/06/2020 1227   LABSPEC 1.010 06/06/2020 1227   PHURINE 7.0 06/06/2020 1227   GLUCOSEU NEGATIVE 06/06/2020 1227   HGBUR NEGATIVE 06/06/2020 1227   BILIRUBINUR NEGATIVE 06/06/2020 1227   KETONESUR NEGATIVE 06/06/2020 1227   PROTEINUR NEGATIVE 06/06/2020 1227   NITRITE NEGATIVE 06/06/2020 1227   LEUKOCYTESUR NEGATIVE 06/06/2020 1227   Sepsis Labs: @LABRCNTIP (procalcitonin:4,lacticidven:4) ) Recent Results (from the past 240 hour(s))  SARS Coronavirus 2 by RT PCR (hospital order, performed in [facility] hospital lab) Nasopharyngeal Nasopharyngeal Swab     Status: None   Collection Time: 06/06/20  7:58 PM   Specimen: Nasopharyngeal Swab  Result Value Ref Range Status   SARS Coronavirus 2 NEGATIVE NEGATIVE Final    Comment: (NOTE) SARS-CoV-2 target nucleic acids are NOT DETECTED.  The SARS-CoV-2 RNA is generally detectable in upper and lower respiratory specimens during  the acute phase of infection. The lowest concentration of SARS-CoV-2 viral copies this assay can detect is 250 copies / mL. A negative result does not preclude SARS-CoV-2 infection and should not be used as the sole basis for treatment or other patient management decisions.  A negative result may occur with improper specimen collection / handling, submission of specimen other than nasopharyngeal swab, presence of viral mutation(s) within the areas targeted by this assay, and inadequate number of viral copies (<250 copies / mL). A negative result must be combined with clinical observations, patient history, and epidemiological information.  Fact Sheet for Patients:   BoilerBrush.com.cy  Fact Sheet for Healthcare Providers: https://pope.com/  This test is not yet approved or  cleared by the [place] FDA and has been authorized for detection  and/or diagnosis of SARS-CoV-2 by FDA under an Emergency Use Authorization (EUA).  This EUA will remain in effect (meaning this test can be used) for the duration of the COVID-19 declaration under Section 564(b)(1) of the Act, 21 U.S.C. section 360bbb-3(b)(1), unless the authorization is terminated or revoked sooner.  Performed at [facility], [address], [place], [place] [address]      Radiological Exams on Admission: CT Angio Chest PE W/Cm &/Or Wo Cm  Addendum Date: 06/07/2020   ADDENDUM REPORT: 06/07/2020 00:35 ADDENDUM: These results were called by telephone at the time of interpretation on 06/07/2020 at 12:35 am to [facility], who verbally acknowledged these results. Electronically Signed   By: Elgie Collard M.D.   On: 06/07/2020 00:35   Result Date: 06/07/2020 CLINICAL DATA:  86 year old female with positive D-dimer. Concern for pulmonary embolism. EXAM: CT ANGIOGRAPHY CHEST WITH CONTRAST TECHNIQUE: Multidetector CT imaging of the chest was performed using the standard protocol during bolus administration of intravenous contrast. Multiplanar CT image reconstructions and MIPs were obtained to evaluate the vascular anatomy. CONTRAST:  26mL OMNIPAQUE IOHEXOL 350 MG/ML SOLN COMPARISON:  Chest radiograph dated 06/06/2020 and CT dated 11/22/2019. FINDINGS: Cardiovascular: There is no cardiomegaly or pericardial effusion. There is coronary vascular calcification and calcification of the mitral annulus. Moderate atherosclerotic calcification of the thoracic aorta. The aorta is tortuous. There are bilateral pulmonary artery emboli involving the right lower lobe segmental branch as well as lobar branch point in the left upper lobe which appear new since the prior CT. There is no CT evidence of right heart straining. Mediastinum/Nodes: No hilar or mediastinal adenopathy. The esophagus is grossly unremarkable. No mediastinal fluid collection. Lungs/Pleura: There is diffuse hazy  airspace opacity which may represent atelectasis or areas of air trapping suggestive of underlying small airways versus small vessel disease. There is a 6 mm right lower lobe subpleural nodule similar to prior CT. Additional 5 mm nodule in the right lower lobe appears similar. There is a 4 mm nodule in the right upper lobe (59/6) similar to prior CT. No lobar consolidation or pneumothorax. The central airways are patent. Upper Abdomen: No acute abnormality. Musculoskeletal: Osteopenia with degenerative changes of the spine. T10 compression fracture as seen previously. No acute osseous pathology. Review of the MIP images confirms the above findings. IMPRESSION: 1. Bilateral pulmonary artery emboli. No CT evidence of right heart straining. 2. Several nodules in the right lung measuring up to 6 mm. Non-contrast chest CT at 3-6 months is recommended. If the nodules are stable at time of repeat CT, then future CT at 18-24 months (from today's scan) is considered optional for low-risk patients, but is recommended for high-risk patients. This  recommendation follows the consensus statement: Guidelines for Management of Incidental Pulmonary Nodules Detected on CT Images: From the Fleischner Society 2017; Radiology 2017; 284:228-243. 3. Aortic Atherosclerosis (ICD10-I70.0). Electronically Signed: By: Elgie CollardArash  Radparvar M.D. On: 06/07/2020 00:13   DG Chest Port 1 View  Result Date: 06/06/2020 CLINICAL DATA:  Cough. EXAM: PORTABLE CHEST 1 VIEW COMPARISON:  11/21/2019 chest radiograph and prior studies FINDINGS: Cardiomegaly and mild pulmonary vascular congestion noted. Increased LEFT LOWER lung opacity may represent airspace disease or atelectasis. Question trace bilateral pleural effusions. No pneumothorax or acute bony abnormality. IMPRESSION: 1. LEFT LOWER lung opacity which may represent atelectasis versus airspace disease/pneumonia. 2. Cardiomegaly with mild pulmonary vascular congestion and question trace bilateral  pleural effusions. Electronically Signed   By: Harmon PierJeffrey  Hu M.D.   On: 06/06/2020 20:27    EKG: Independently reviewed.  Normal sinus rhythm.  Assessment/Plan Principal Problem:   Acute pulmonary embolism (HCC) Active Problems:   Essential hypertension   Hypothyroidism    1. Acute pulmonary embolism second episode this year with previous episode of saddle embolism in February 2021 with right lower extremity DVT presently started on heparin likely will need lifelong anticoagulation.  Noted that patient did have history of GI bleed last year.  Will check 2D echo for any strain pattern.  Presently hemodynamically stable PEs likely unprovoked. 2. Essential hypertension on ARB and metoprolol. 3. Hypothyroidism on Synthroid. 4. Patient is on prednisone tapering dose presently on 5 mg every other day.  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   DVT prophylaxis: Heparin. Code Status: Full code. Family Communication: Patient's daughter. Disposition Plan: Home. Consults called: None. Admission status: Observation.   Eduard ClosArshad N Zafirah Vanzee MD [facility] Pager [phone].  If 7PM-7AM, please contact night-coverage [email or web address] Password [email or web address]  06/07/2020, 1:27 AM

## 2020-06-07 NOTE — Progress Notes (Signed)
  Echocardiogram 2D Echocardiogram has been attempted. Patient eating. Will reattempt at later time. Catherine Hess 06/07/2020, 8:53 AM

## 2020-06-07 NOTE — Progress Notes (Signed)
Venous duplex lower ext.  has been completed. Refer to [facility] under chart review to view preliminary results.   06/07/2020  11:03 AM Haseeb Fiallos, Gerarda Gunther

## 2020-06-07 NOTE — ED Notes (Signed)
Ordered breakfast--Catherine Hess 

## 2020-06-07 NOTE — ED Notes (Signed)
Patient found with blood pressure cuff removed. Patient states BP cuff hurts and squeezes too tightly. New cuff applied, need for routine v/s explained to pt.

## 2020-06-07 NOTE — Progress Notes (Signed)
ANTICOAGULATION CONSULT NOTE  Pharmacy Consult for Heparin Indication: pulmonary embolus  Allergies  Allergen Reactions  . Azithromycin Swelling    Patient Measurements: Height: 5\' 3"  (160 cm) Weight: 65.8 kg (145 lb) IBW/kg (Calculated) : 52.4  Heparin Dosing Weight:  65.6 kg  Vital Signs: BP: 105/62 (08/23 1630) Pulse Rate: 77 (08/23 1630)  Labs: Recent Labs    06/06/20 1229 06/06/20 1229 06/06/20 1958 06/07/20 0126 06/07/20 0326 06/07/20 0950  HGB 12.7  --   --   --  13.5  --   HCT 40.1  --   --   --  41.9  --   PLT 202  --   --   --  174  --   HEPARINUNFRC  --   --   --   --   --  0.65  CREATININE 0.79  --   --  0.80  --   --   TROPONINIHS 14   < > 15 17 18*  --    < > = values in this interval not displayed.    Estimated Creatinine Clearance: 40.1 mL/min (by C-G formula based on SCr of 0.8 mg/dL).   Medical History: Past Medical History:  Diagnosis Date  . Dementia (HCC)   . Hypertension   . Hypothyroidism     Assessment: 86 yo female presents with wheezing and cough. The patient was on apixaban for history of PE back in Feb 2021 with the original plan being for 6 months of therapy but treatment appears to have ended in July. The patient or patient's daughter was not able to confirm an exact therapy end date for apixaban. The CT scan shows a new bilateral PE without evidence of heart strain. Pharmacy is consulted to dose heparin.  The patients heparin level is currently supratherapeutic at 0.78 while running at 1100 units/hour. The patient's CBC is WNL and there are no signs or symptoms of bleeding documented. Will adjust heparin infusion rate accordingly.  Goal of Therapy:  Heparin level 0.3-0.7 units/ml Monitor platelets by anticoagulation protocol: Yes   Plan:  Decrease heparin IV to 1000 units/hour Obtain 8-hour heparin level Monitor daily heparin level and CBC Monitor signs and symptoms of bleeding  August, PharmD, RPh  PGY-1  Pharmacy Resident 06/07/2020 7:01 PM  Please check [email or web address] for unit-specific pharmacy phone numbers.

## 2020-06-07 NOTE — Progress Notes (Signed)
  Echocardiogram 2D Echocardiogram has been performed.  Dorena Dew Godfrey Tritschler 06/07/2020, 11:37 AM

## 2020-06-07 NOTE — Progress Notes (Signed)
ANTICOAGULATION CONSULT NOTE - Initial Consult  Pharmacy Consult for Heparin Indication: pulmonary embolus  Allergies  Allergen Reactions  . Azithromycin Swelling    Patient Measurements: Height: 5\' 3"  (160 cm) Weight: 65.8 kg (145 lb) IBW/kg (Calculated) : 52.4  Vital Signs: Temp: 98.6 F (37 C) (08/22 1723) Temp Source: Oral (08/22 1723) BP: 148/95 (08/22 2230) Pulse Rate: 83 (08/22 2230)  Labs: Recent Labs    06/06/20 1229 06/06/20 1958  HGB 12.7  --   HCT 40.1  --   PLT 202  --   CREATININE 0.79  --   TROPONINIHS 14 15    Estimated Creatinine Clearance: 40.1 mL/min (by C-G formula based on SCr of 0.79 mg/dL).   Medical History: Past Medical History:  Diagnosis Date  . Dementia (HCC)   . Hypertension   . Hypothyroidism     Medications:  Awaiting electronic med rec  Assessment: 86 y.o. F presents with wheezing and cough. Noted pt was on apixaban for h/o PE back in Feb 2021 - original plan was for 6 mos treatment so appears to have ended in July. Pt unable to give August info about meds and daughter not answering phone to discuss meds. Pt with new b/l PE on CT scan. No evidence of heart strain. To begin heparin per pharmacy.  Goal of Therapy:  Heparin level 0.3-0.7 units/ml Monitor platelets by anticoagulation protocol: Yes   Plan:  Heparin IV bolus 4000 units Heparin gtt at 1100 units/hr Will f/u heparin level in 8 hours Daily heparin level and CBC  [place], PharmD, BCPS Please see amion for complete clinical pharmacist phone list 06/07/2020,12:28 AM

## 2020-06-07 NOTE — Progress Notes (Signed)
ANTICOAGULATION CONSULT NOTE  Pharmacy Consult for Heparin Indication: pulmonary embolus  Allergies  Allergen Reactions  . Azithromycin Swelling    Patient Measurements: Height: 5\' 3"  (160 cm) Weight: 65.8 kg (145 lb) IBW/kg (Calculated) : 52.4  Vital Signs: Temp: 98.5 F (36.9 C) (08/23 0108) Temp Source: Oral (08/23 0108) BP: 149/75 (08/23 0730) Pulse Rate: 87 (08/23 0730)  Labs: Recent Labs    06/06/20 1229 06/06/20 1229 06/06/20 1958 06/07/20 0126 06/07/20 0326 06/07/20 0950  HGB 12.7  --   --   --  13.5  --   HCT 40.1  --   --   --  41.9  --   PLT 202  --   --   --  174  --   HEPARINUNFRC  --   --   --   --   --  0.65  CREATININE 0.79  --   --  0.80  --   --   TROPONINIHS 14   < > 15 17 18*  --    < > = values in this interval not displayed.    Estimated Creatinine Clearance: 40.1 mL/min (by C-G formula based on SCr of 0.8 mg/dL).   Medical History: Past Medical History:  Diagnosis Date  . Dementia (HCC)   . Hypertension   . Hypothyroidism     Medications:  Awaiting electronic med rec  Assessment: 86 y.o. F presents with wheezing and cough. Noted pt was on apixaban for h/o PE back in Feb 2021 - original plan was for 6 mos treatment so appears to have ended in July. Pt unable to give August info about meds and daughter not answering phone to discuss meds. Pt with new b/l PE on CT scan. No evidence of heart strain. To begin heparin per pharmacy.  Initial heparin level therapeutic on 1100 units/hr  Goal of Therapy:  Heparin level 0.3-0.7 units/ml Monitor platelets by anticoagulation protocol: Yes   Plan:  Continue heparin gtt at 1100 units/hr F/u 8 hour heparin level to confirm  [place], PharmD Clinical Pharmacist ED Pharmacist Phone # [phone] 06/07/2020 11:02 AM

## 2020-06-07 NOTE — Progress Notes (Signed)
PROGRESS NOTE    Catherine Hess  ZOX:[id number] DOB: 07-Dec-1924 DOA: 06/06/2020 PCP: Tacy Learn, FNP   Brief Narrative:  HPI on 06/07/2020 by Dr. Midge Minium Catherine Hess is a 86 y.o. female with history of hypertension, hypothyroidism admitted in February 2021 of this year for saddle embolus and right lower extremity DVT was on anticoagulation for 6 months and discontinued recently was found to be increasingly short of breath last few days particularly on exertion with some wheezing.  Patient also has right lower extremity swelling which patient's daughter states has not gone since her last DVT diagnosis in February 2021.  Denies any fever chills nausea vomiting or diarrhea. Assessment & Plan   Patient admitted earlier today by Dr. Toniann Fail.  See H&P for details.  Acute pulmonary embolism -Patient presented with shortness of breath -Of note she was noted to have right lower extremity DVT and saddle embolus in February 2021 and was placed on anticoagulation. -Currently on heparin -Echocardiogram EF 65-70%. No RWMA. Moderate LVH. G1DD -Lower extremity Doppler showed right chronic DVT involving right femoral,popliteal, peroneal and posterior tibial veins.  Left showed consistent chronic DVT involving left posterior tibial and peroneal veins.  Also cystic structure noted popliteal fossa on the left. -history of diverticular bleed in 2020 -monitor H/H  Essential hypertension -Continue losartan, metoprolol  Hypothyroidism -Continue Synthroid  Nonspecific arthritis -Patient currently on prednisone with tapering dose of 5 mg every other day  Deconditioning -Will consult PT/OT  DVT Prophylaxis Heparin  Code Status: Full  Family Communication: None at bedside  Disposition Plan:  Status is: Observation  The patient will require care spanning > 2 midnights and should be moved to inpatient because: IV treatments appropriate due to intensity of illness or inability to  take PO and Inpatient level of care appropriate due to severity of illness  Dispo: The patient is from: Home              Anticipated d/c is to: Home              Anticipated d/c date is: 1 day              Patient currently is not medically stable to d/c.    Consultants None  Procedures  Echocardiogram Lower extremity doppler   Antibiotics   Anti-infectives (From admission, onward)   None      Subjective:   Catherine Hess seen and examined today.  Continues to have shortness of breath. Denies current chest pain, abdominal pain, N/V/C/D.   Objective:   Vitals:   06/07/20 0515 06/07/20 0630 06/07/20 0730 06/07/20 1144  BP:   (!) 149/75 128/67  Pulse: 83 82 87 81  Resp: (!) Temp:      TempSrc:      SpO2: 90% 90% 90%   Weight:      Height:        Intake/Output Summary (Last 24 hours) at 06/07/2020 1548 Last data filed at 06/07/2020 0510 Gross per 24 hour  Intake --  Output 2000 ml  Net -2000 ml   Filed Weights   06/06/20 2004  Weight: 65.8 kg    Exam  General: Well developed, well nourished, NAD, appears stated age  HEENT: NCAT, mucous membranes moist.   Cardiovascular: S1 S2 auscultated, RRR  Respiratory: Clear to auscultation bilaterally  Abdomen: Soft, nontender, nondistended, + bowel sounds  Extremities: warm dry without cyanosis clubbing or edema  Neuro: Alert and awake, nonfocal  Data Reviewed: I have personally reviewed following labs and imaging studies  CBC: Recent Labs  Lab 06/06/20 1229 06/07/20 0326  WBC 8.8 7.7  HGB 12.7 13.5  HCT 40.1 41.9  MCV 100.8* 100.0  PLT 202 174   Basic Metabolic Panel: Recent Labs  Lab 06/06/20 1229 06/07/20 0126  NA 137 138  K 4.5 3.7  CL 103 102  CO2 24 27  GLUCOSE 117* 124*  BUN 12 12  CREATININE 0.79 0.80  CALCIUM 9.9 9.3   GFR: Estimated Creatinine Clearance: 40.1 mL/min (by C-G formula based on SCr of 0.8 mg/dL). Liver Function Tests: No results for input(s): AST,  ALT, ALKPHOS, BILITOT, PROT, ALBUMIN in the last 168 hours. No results for input(s): LIPASE, AMYLASE in the last 168 hours. No results for input(s): AMMONIA in the last 168 hours. Coagulation Profile: No results for input(s): INR, PROTIME in the last 168 hours. Cardiac Enzymes: No results for input(s): CKTOTAL, CKMB, CKMBINDEX, TROPONINI in the last 168 hours. BNP (last 3 results) No results for input(s): PROBNP in the last 8760 hours. HbA1C: No results for input(s): HGBA1C in the last 72 hours. CBG: No results for input(s): GLUCAP in the last 168 hours. Lipid Profile: No results for input(s): CHOL, HDL, LDLCALC, TRIG, CHOLHDL, LDLDIRECT in the last 72 hours. Thyroid Function Tests: No results for input(s): TSH, T4TOTAL, FREET4, T3FREE, THYROIDAB in the last 72 hours. Anemia Panel: No results for input(s): VITAMINB12, FOLATE, FERRITIN, TIBC, IRON, RETICCTPCT in the last 72 hours. Urine analysis:    Component Value Date/Time   COLORURINE YELLOW 06/06/2020 1227   APPEARANCEUR CLEAR 06/06/2020 1227   LABSPEC 1.010 06/06/2020 1227   PHURINE 7.0 06/06/2020 1227   GLUCOSEU NEGATIVE 06/06/2020 1227   HGBUR NEGATIVE 06/06/2020 1227   BILIRUBINUR NEGATIVE 06/06/2020 1227   KETONESUR NEGATIVE 06/06/2020 1227   PROTEINUR NEGATIVE 06/06/2020 1227   NITRITE NEGATIVE 06/06/2020 1227   LEUKOCYTESUR NEGATIVE 06/06/2020 1227   Sepsis Labs: @LABRCNTIP (procalcitonin:4,lacticidven:4)  ) Recent Results (from the past 240 hour(s))  SARS Coronavirus 2 by RT PCR (hospital order, performed in [facility] hospital lab) Nasopharyngeal Nasopharyngeal Swab     Status: None   Collection Time: 06/06/20  7:58 PM   Specimen: Nasopharyngeal Swab  Result Value Ref Range Status   SARS Coronavirus 2 NEGATIVE NEGATIVE Final    Comment: (NOTE) SARS-CoV-2 target nucleic acids are NOT DETECTED.  The SARS-CoV-2 RNA is generally detectable in upper and lower respiratory specimens during the acute phase of  infection. The lowest concentration of SARS-CoV-2 viral copies this assay can detect is 250 copies / mL. A negative result does not preclude SARS-CoV-2 infection and should not be used as the sole basis for treatment or other patient management decisions.  A negative result may occur with improper specimen collection / handling, submission of specimen other than nasopharyngeal swab, presence of viral mutation(s) within the areas targeted by this assay, and inadequate number of viral copies (<250 copies / mL). A negative result must be combined with clinical observations, patient history, and epidemiological information.  Fact Sheet for Patients:   06/08/20  Fact Sheet for Healthcare Providers: BoilerBrush.com.cy  This test is not yet approved or  cleared by the https://pope.com/ FDA and has been authorized for detection and/or diagnosis of SARS-CoV-2 by FDA under an Emergency Use Authorization (EUA).  This EUA will remain in effect (meaning this test can be used) for the duration of the COVID-19 declaration under Section 564(b)(1) of the Act, 21 U.S.C. section  360bbb-3(b)(1), unless the authorization is terminated or revoked sooner.  Performed at [facility], [address], [place], [place] [address]       Radiology Studies: CT Angio Chest PE W/Cm &/Or Wo Cm  Addendum Date: 06/07/2020   ADDENDUM REPORT: 06/07/2020 00:35 ADDENDUM: These results were called by telephone at the time of interpretation on 06/07/2020 at 12:35 am to [facility], who verbally acknowledged these results. Electronically Signed   By: Elgie Collard M.D.   On: 06/07/2020 00:35   Result Date: 06/07/2020 CLINICAL DATA:  86 year old female with positive D-dimer. Concern for pulmonary embolism. EXAM: CT ANGIOGRAPHY CHEST WITH CONTRAST TECHNIQUE: Multidetector CT imaging of the chest was performed using the standard protocol during bolus  administration of intravenous contrast. Multiplanar CT image reconstructions and MIPs were obtained to evaluate the vascular anatomy. CONTRAST:  75mL OMNIPAQUE IOHEXOL 350 MG/ML SOLN COMPARISON:  Chest radiograph dated 06/06/2020 and CT dated 11/22/2019. FINDINGS: Cardiovascular: There is no cardiomegaly or pericardial effusion. There is coronary vascular calcification and calcification of the mitral annulus. Moderate atherosclerotic calcification of the thoracic aorta. The aorta is tortuous. There are bilateral pulmonary artery emboli involving the right lower lobe segmental branch as well as lobar branch point in the left upper lobe which appear new since the prior CT. There is no CT evidence of right heart straining. Mediastinum/Nodes: No hilar or mediastinal adenopathy. The esophagus is grossly unremarkable. No mediastinal fluid collection. Lungs/Pleura: There is diffuse hazy airspace opacity which may represent atelectasis or areas of air trapping suggestive of underlying small airways versus small vessel disease. There is a 6 mm right lower lobe subpleural nodule similar to prior CT. Additional 5 mm nodule in the right lower lobe appears similar. There is a 4 mm nodule in the right upper lobe (59/6) similar to prior CT. No lobar consolidation or pneumothorax. The central airways are patent. Upper Abdomen: No acute abnormality. Musculoskeletal: Osteopenia with degenerative changes of the spine. T10 compression fracture as seen previously. No acute osseous pathology. Review of the MIP images confirms the above findings. IMPRESSION: 1. Bilateral pulmonary artery emboli. No CT evidence of right heart straining. 2. Several nodules in the right lung measuring up to 6 mm. Non-contrast chest CT at 3-6 months is recommended. If the nodules are stable at time of repeat CT, then future CT at 18-24 months (from today's scan) is considered optional for low-risk patients, but is recommended for high-risk patients. This  recommendation follows the consensus statement: Guidelines for Management of Incidental Pulmonary Nodules Detected on CT Images: From the Fleischner Society 2017; Radiology 2017; 284:228-243. 3. Aortic Atherosclerosis (ICD10-I70.0). Electronically Signed: By: Elgie Collard M.D. On: 06/07/2020 00:13   DG Chest Port 1 View  Result Date: 06/06/2020 CLINICAL DATA:  Cough. EXAM: PORTABLE CHEST 1 VIEW COMPARISON:  11/21/2019 chest radiograph and prior studies FINDINGS: Cardiomegaly and mild pulmonary vascular congestion noted. Increased LEFT LOWER lung opacity may represent airspace disease or atelectasis. Question trace bilateral pleural effusions. No pneumothorax or acute bony abnormality. IMPRESSION: 1. LEFT LOWER lung opacity which may represent atelectasis versus airspace disease/pneumonia. 2. Cardiomegaly with mild pulmonary vascular congestion and question trace bilateral pleural effusions. Electronically Signed   By: Harmon Pier M.D.   On: 06/06/2020 20:27   ECHOCARDIOGRAM COMPLETE  Result Date: 06/07/2020    ECHOCARDIOGRAM REPORT   Patient Name:   Catherine Hess Date of Exam: 06/07/2020 Medical Rec #:  [id number]       Height:  63.0 in Accession #:    [id number]      Weight:       145.0 lb Date of Birth:  [birth year]        BSA:          1.687 m Patient Age:    89 years        BP:           127/71 mmHg Patient Gender: F               HR:           77 bpm. Exam Location:  Inpatient Procedure: 2D Echo, Cardiac Doppler and Color Doppler Indications:    I26.02 Pulmonary embolus  History:        Patient has no prior history of Echocardiogram examinations.                 Risk Factors:Hypertension. Hypothyroidism.  Sonographer:    Elmarie Shiley Dance Referring Phys: [id number] ARSHAD N KAKRAKANDY IMPRESSIONS  1. Left ventricular ejection fraction, by estimation, is 65 to 70%. The left ventricle has normal function. The left ventricle has no regional wall motion abnormalities. There is moderate left ventricular  hypertrophy. Left ventricular diastolic parameters are consistent with Grade I diastolic dysfunction (impaired relaxation). Elevated left ventricular end-diastolic pressure.  2. Right ventricular systolic function is normal. The right ventricular size is normal. There is normal pulmonary artery systolic pressure. The estimated right ventricular systolic pressure is 19.3 mmHg.  3. The mitral valve is abnormal. Trivial mitral valve regurgitation.  4. The aortic valve is tricuspid. Aortic valve regurgitation is mild. Mild aortic valve sclerosis is present, with no evidence of aortic valve stenosis.  5. The inferior vena cava is normal in size with greater than 50% respiratory variability, suggesting right atrial pressure of 3 mmHg. Conclusion(s)/Recommendation(s): Findings consistent with no evidence of right heart strain. FINDINGS  Left Ventricle: Left ventricular ejection fraction, by estimation, is 65 to 70%. The left ventricle has normal function. The left ventricle has no regional wall motion abnormalities. The left ventricular internal cavity size was normal in size. There is  moderate left ventricular hypertrophy. Left ventricular diastolic parameters are consistent with Grade I diastolic dysfunction (impaired relaxation). Elevated left ventricular end-diastolic pressure. Right Ventricle: The right ventricular size is normal. No increase in right ventricular wall thickness. Right ventricular systolic function is normal. There is normal pulmonary artery systolic pressure. The tricuspid regurgitant velocity is 2.02 m/s, and  with an assumed right atrial pressure of 3 mmHg, the estimated right ventricular systolic pressure is 19.3 mmHg. Left Atrium: Left atrial size was normal in size. Right Atrium: Right atrial size was normal in size. Pericardium: There is no evidence of pericardial effusion. Mitral Valve: The mitral valve is abnormal. Mild mitral annular calcification. Trivial mitral valve regurgitation.  Tricuspid Valve: The tricuspid valve is grossly normal. Tricuspid valve regurgitation is trivial. Aortic Valve: The aortic valve is tricuspid. Aortic valve regurgitation is mild. Aortic regurgitation PHT measures 490 msec. Mild aortic valve sclerosis is present, with no evidence of aortic valve stenosis. Pulmonic Valve: The pulmonic valve was normal in structure. Pulmonic valve regurgitation is not visualized. Aorta: The aortic root and ascending aorta are structurally normal, with no evidence of dilitation. Venous: The inferior vena cava is normal in size with greater than 50% respiratory variability, suggesting right atrial pressure of 3 mmHg. IAS/Shunts: No atrial level shunt detected by color flow Doppler.  LEFT VENTRICLE PLAX 2D LVIDd:  3.50 cm  Diastology LVIDs:         2.14 cm  LV e' lateral:   3.92 cm/s LV PW:         1.40 cm  LV E/e' lateral: 18.1 LV IVS:        1.21 cm  LV e' medial:    3.15 cm/s LVOT diam:     1.80 cm  LV E/e' medial:  22.5 LV SV:         66 LV SV Index:   39 LVOT Area:     2.54 cm  RIGHT VENTRICLE             IVC RV Basal diam:  2.28 cm     IVC diam: 1.54 cm RV S prime:     11.50 cm/s TAPSE (M-mode): 1.6 cm LEFT ATRIUM             Index       RIGHT ATRIUM           Index LA diam:        2.70 cm 1.60 cm/m  RA Area:     10.30 cm LA Vol (A2C):   33.5 ml 19.86 ml/m RA Volume:   21.40 ml  12.69 ml/m LA Vol (A4C):   19.6 ml 11.62 ml/m LA Biplane Vol: 27.0 ml 16.01 ml/m  AORTIC VALVE LVOT Vmax:   152.00 cm/s LVOT Vmean:  90.400 cm/s LVOT VTI:    0.260 m AI PHT:      490 msec  AORTA Ao Root diam: 3.40 cm Ao Asc diam:  3.40 cm MITRAL VALVE                TRICUSPID VALVE MV Area (PHT): 1.86 cm     TV Peak grad:   17.3 mmHg MV Decel Time: 408 msec     TV Vmax:        2.08 m/s MV E velocity: 71.00 cm/s   TR Peak grad:   16.3 mmHg MV A velocity: 133.00 cm/s  TR Vmax:        202.00 cm/s MV E/A ratio:  0.53                             SHUNTS                             Systemic VTI:   0.26 m                             Systemic Diam: 1.80 cm Zoila Shutter MD Electronically signed by Zoila Shutter MD Signature Date/Time: 06/07/2020/12:47:28 PM    Final    VAS [place] LOWER EXTREMITY VENOUS (DVT) (ONLY [facility] & [facility])  Result Date: 06/07/2020  Lower Venous DVTStudy Indications: Swelling, and History of LLE DVT on 11/22/19.  Performing Technologist: Marilynne Halsted RDMS, RVT  Examination Guidelines: A complete evaluation includes B-mode imaging, spectral Doppler, color Doppler, and power Doppler as needed of all accessible portions of each vessel. Bilateral testing is considered an integral part of a complete examination. Limited examinations for reoccurring indications may be performed as noted. The reflux portion of the exam is performed with the patient in reverse Trendelenburg.  +---------+---------------+---------+-----------+----------+--------------+ RIGHT    CompressibilityPhasicitySpontaneityPropertiesThrombus Aging +---------+---------------+---------+-----------+----------+--------------+ CFV      Full  Yes      Yes                                 +---------+---------------+---------+-----------+----------+--------------+ SFJ      Full                                                        +---------+---------------+---------+-----------+----------+--------------+ FV Prox  Full                                                        +---------+---------------+---------+-----------+----------+--------------+ FV Mid   Partial                                      Chronic        +---------+---------------+---------+-----------+----------+--------------+ FV DistalPartial        Yes      Yes                  Chronic        +---------+---------------+---------+-----------+----------+--------------+ PFV      Full                                                        +---------+---------------+---------+-----------+----------+--------------+ POP       None           No       No                   Chronic        +---------+---------------+---------+-----------+----------+--------------+ PTV      Partial                                      Chronic        +---------+---------------+---------+-----------+----------+--------------+ PERO     Partial                                      Chronic        +---------+---------------+---------+-----------+----------+--------------+   +---------+---------------+---------+-----------+----------+--------------+ LEFT     CompressibilityPhasicitySpontaneityPropertiesThrombus Aging +---------+---------------+---------+-----------+----------+--------------+ CFV      Full           Yes      Yes                                 +---------+---------------+---------+-----------+----------+--------------+ SFJ      Full                                                        +---------+---------------+---------+-----------+----------+--------------+  FV Prox  Full                                                        +---------+---------------+---------+-----------+----------+--------------+ FV Mid   Full                                                        +---------+---------------+---------+-----------+----------+--------------+ FV DistalFull                                                        +---------+---------------+---------+-----------+----------+--------------+ PFV      Full           Yes      Yes                                 +---------+---------------+---------+-----------+----------+--------------+ POP      Full                                                        +---------+---------------+---------+-----------+----------+--------------+ PTV      Partial                                      Chronic        +---------+---------------+---------+-----------+----------+--------------+ PERO     Partial                                       Chronic        +---------+---------------+---------+-----------+----------+--------------+     Summary: RIGHT: - Findings consistent with chronic deep vein thrombosis involving the right femoral vein, right popliteal vein, right peroneal veins, and right posterior tibial veins. - No cystic structure found in the popliteal fossa.  LEFT: - Findings consistent with chronic deep vein thrombosis involving the left posterior tibial veins, and left peroneal veins. - Findings appear improved from previous examination. - A cystic structure is found in the popliteal fossa. - Baker's cyst 3.8 x 3.5 x 1.2 cm.  *See table(s) above for measurements and observations.    Preliminary      Scheduled Meds: . ascorbic acid  500 mg Oral Daily  . calcium-vitamin D   Oral QHS  . levothyroxine  100 mcg Oral QAC breakfast  . losartan  25 mg Oral Daily  . metoprolol tartrate  12.5 mg Oral BID  . multivitamin with minerals  1 tablet Oral Daily  . pantoprazole  40 mg Oral Daily  . predniSONE  5 mg Oral QODAY  . simvastatin  20 mg Oral QHS  . vitamin E  100 Units Oral Daily   Continuous Infusions: . heparin  1,100 Units/hr (06/07/20 0114)     LOS: 0 days   Time Spent in minutes   30 minutes  Antonietta Lansdowne D.O. on 06/07/2020 at 3:48 PM  Between 7am to 7pm - Please see pager noted on [email or web address]  After 7pm go to [email or web address]  And look for the night coverage person covering for me after hours  [facility] Office  [phone]

## 2020-06-08 DIAGNOSIS — E039 Hypothyroidism, unspecified: Secondary | ICD-10-CM

## 2020-06-08 LAB — CBC
HCT: 37.3 % (ref 36.0–46.0)
Hemoglobin: 12.2 g/dL (ref 12.0–15.0)
MCH: 31.9 pg (ref 26.0–34.0)
MCHC: 32.7 g/dL (ref 30.0–36.0)
MCV: 97.6 fL (ref 80.0–100.0)
Platelets: 192 10*3/uL (ref 150–400)
RBC: 3.82 MIL/uL — ABNORMAL LOW (ref 3.87–5.11)
RDW: 14.4 % (ref 11.5–15.5)
WBC: 7 10*3/uL (ref 4.0–10.5)
nRBC: 0 % (ref 0.0–0.2)

## 2020-06-08 LAB — HEPARIN LEVEL (UNFRACTIONATED)
Heparin Unfractionated: 0.61 IU/mL (ref 0.30–0.70)
Heparin Unfractionated: 0.45 IU/mL (ref 0.30–0.70)

## 2020-06-08 MED ORDER — MENTHOL 3 MG MT LOZG
1.0000 | LOZENGE | OROMUCOSAL | Status: DC | PRN
  Administered 2020-06-08: 3 mg via ORAL
  Filled 2020-06-08: qty 9

## 2020-06-08 MED ORDER — APIXABAN 5 MG PO TABS: 5.0000 mg | ORAL_TABLET | Freq: Two times a day (BID) | ORAL | Status: DC

## 2020-06-08 MED ORDER — APIXABAN 5 MG PO TABS
10.0000 mg | ORAL_TABLET | Freq: Two times a day (BID) | ORAL | Status: DC
  Administered 2020-06-08 – 2020-06-10 (×4): 10 mg via ORAL
  Filled 2020-06-08 (×5): qty 2

## 2020-06-08 MED ORDER — SALINE SPRAY 0.65 % NA SOLN
1.0000 | NASAL | Status: DC | PRN
  Administered 2020-06-08: 1 via NASAL
  Filled 2020-06-08 (×2): qty 44

## 2020-06-08 MED ORDER — PHENOL 1.4 % MT LIQD: 1.0000 | OROMUCOSAL | Status: DC | PRN

## 2020-06-08 NOTE — Evaluation (Signed)
Physical Therapy Evaluation Patient Details Name: Catherine Hess MRN: [id number] DOB: [birth year] Today's Date: 06/08/2020   History of Present Illness  Catherine Hess is a 86 y.o. female with history of hypertension, hypothyroidism admitted in February 2021 of this year for saddle embolus and right lower extremity DVT was on anticoagulation for 6 months and discontinued recently was found to be increasingly short of breath last few days particularly on exertion with some wheezing. Pt found to have pulmonary embolism  Clinical Impression  Pt admitted with/for increasing SOB found to be in part due to a PE.  Pt generally needs min assist for all mobility.  I'm not sure if this is baseline or not. .  Pt currently limited functionally due to the problems listed below.  (see problems list.)  Pt will benefit from PT to maximize function and safety to be able to get home safely with available assist.     Follow Up Recommendations Home health PT;Supervision/Assistance - 24 hour    Equipment Recommendations  None recommended by PT    Recommendations for Other Services       Precautions / Restrictions Precautions Precautions: Fall      Mobility  Bed Mobility Overal bed mobility: Needs Assistance Bed Mobility: Supine to Sit;Sit to Supine     Supine to sit: Min assist     General bed mobility comments: pt brough her legs off and assisted pt up and forward via R elbow.  She laboriously scoot herself to EOB  Transfers Overall transfer level: Needs assistance Equipment used: Rolling walker (2 wheeled) Transfers: Sit to/from Stand Sit to Stand: Min assist         General transfer comment: min from lower surface.  cues for hand placement and assist forward/ Up  Ambulation/Gait Ambulation/Gait assistance: Min assist Gait Distance (Feet): 12 Feet Assistive device: Rolling walker (2 wheeled) Gait Pattern/deviations: Step-through pattern Gait velocity: sloower Gait velocity  interpretation: <1.31 ft/sec, indicative of household ambulator General Gait Details: generally short mildly unsteady steps with assist to help maneuver the RW  Stairs            Wheelchair Mobility    Modified Rankin (Stroke Patients Only)       Balance Overall balance assessment: Needs assistance Sitting-balance support: Bilateral upper extremity supported;Feet supported;No upper extremity supported Sitting balance-Leahy Scale: Fair Sitting balance - Comments: sat EOB without UE assist   Standing balance support: Bilateral upper extremity supported;During functional activity Standing balance-Leahy Scale: Poor Standing balance comment: relaint on B UE on RW.                             Pertinent Vitals/Pain Faces Pain Scale: Hurts a little bit Pain Location: B LEs, arthritis (audible crepitation) Pain Descriptors / Indicators: Discomfort;Grimacing Pain Intervention(s): Monitored during session    Home Living Family/patient expects to be discharged to:: Private residence Living Arrangements: Children (daughter) Available Help at Discharge: Family;Personal care attendant (daughter works, but has an Engineer, production that assists during day) Type of Home: House Home Access: Stairs to enter Entrance Stairs-Rails: None Entrance Stairs-Number of Steps: 3 Home Layout: One level Home Equipment: Environmental consultant - 4 wheels;Walker - 2 wheels;Tub bench;Hospital bed;Wheelchair - manual Additional Comments: per pt there are times during the wee that she is alone at home.    Prior Function Level of Independence: Needs assistance   Gait / Transfers Assistance Needed: rollator for mobility. Assist for stair management.  ADL's /  Homemaking Assistance Needed: assisted for meals, housekeeping and showering from aide/family. Pt reports occasional assist with toileting/dressing tasks        Hand Dominance   Dominant Hand: Right    Extremity/Trunk Assessment   Upper Extremity  Assessment Upper Extremity Assessment: Generalized weakness;Overall [facility] for tasks assessed    Lower Extremity Assessment Lower Extremity Assessment: Generalized weakness    Cervical / Trunk Assessment Cervical / Trunk Assessment: Kyphotic  Communication   Communication: HOH  Cognition Arousal/Alertness: Awake/alert Behavior During Therapy: Anxious;Restless Overall Cognitive Status: History of cognitive impairments - at baseline                                 General Comments: pt with cognitive deficits at baseline, reports she is 86 yo      General Comments General comments (skin integrity, edema, etc.): vss, pt was pleasant, a little anxious about mobility, needed repetitive cues for information forgoten in the short term.    Exercises Other Exercises Other Exercises: warm up ROM exercise for knees/hips bil   Assessment/Plan    PT Assessment Patient needs continued PT services  PT Problem List Decreased strength;Decreased activity tolerance;Decreased balance;Decreased mobility;Decreased knowledge of use of DME;Decreased safety awareness;Cardiopulmonary status limiting activity;Pain       PT Treatment Interventions Gait training;DME instruction;Stair training;Functional mobility training;Therapeutic activities;Patient/family education    PT Goals (Current goals can be found in the Care Plan section)  Acute Rehab PT Goals Patient Stated Goal: I'd like to go home PT Goal Formulation: With patient Time For Goal Achievement: 06/22/20 Potential to Achieve Goals: Good    Frequency Min 3X/week   Barriers to discharge Other (comment) (not sure if pt has 24/7 * or close assist)      Co-evaluation               AM-PAC PT "6 Clicks" Mobility  Outcome Measure Help needed turning from your back to your side while in a flat bed without using bedrails?: A Little Help needed moving from lying on your back to sitting on the side of a flat bed without using  bedrails?: A Little Help needed moving to and from a bed to a chair (including a wheelchair)?: A Little Help needed standing up from a chair using your arms (e.g., wheelchair or bedside chair)?: A Little Help needed to walk in hospital room?: A Little Help needed climbing 3-5 steps with a railing? : A Lot 6 Click Score: 17    End of Session   Activity Tolerance: Patient tolerated treatment well Patient left: in chair;with call bell/phone within reach;with chair alarm set Nurse Communication: Mobility status PT Visit Diagnosis: Unsteadiness on feet (R26.81);Muscle weakness (generalized) (M62.81)    Time: 3016-0109 PT Time Calculation (min) (ACUTE ONLY): 25 min   Charges:   PT Evaluation $PT Eval Moderate Complexity: 1 Mod PT Treatments $Gait Training: 8-22 mins        06/08/2020  Jacinto Halim., PT Acute Rehabilitation Services [phone]  (pager) [phone]  (office)  Eliseo Gum Keenan Dimitrov 06/08/2020, 4:37 PM

## 2020-06-08 NOTE — Progress Notes (Addendum)
PROGRESS NOTE    Catherine Hess  NWG:[id number] DOB: 08/21/1925 DOA: 06/06/2020 PCP: Tacy LearnGrabowski, Kristen, FNP   Brief Narrative:  HPI on 06/07/2020 by Dr. Midge MiniumArshad Kakrakandy Catherine Hess is a 86 y.o. female with history of hypertension, hypothyroidism admitted in February 2021 of this year for saddle embolus and right lower extremity DVT was on anticoagulation for 6 months and discontinued recently was found to be increasingly short of breath last few days particularly on exertion with some wheezing.  Patient also has right lower extremity swelling which patient's daughter states has not gone since her last DVT diagnosis in February 2021.  Denies any fever chills nausea vomiting or diarrhea.  Interim history Patient admitted with acute pulmonary embolism.  She had recently completed 6 months of anticoagulation for PE and DVT noted in February 2021.  Pending PT and OT. Assessment & Plan   Acute pulmonary embolism -Patient presented with shortness of breath -Of note she was noted to have right lower extremity DVT and saddle embolus in February 2021 and was placed on anticoagulation. -Currently on heparin -Echocardiogram EF 65-70%. No RWMA. Moderate LVH. G1DD -Lower extremity Doppler showed right chronic DVT involving right femoral,popliteal, peroneal and posterior tibial veins.  Left showed consistent chronic DVT involving left posterior tibial and peroneal veins.  Also cystic structure noted popliteal fossa on the left. -history of diverticular bleed in 2020 -will transition to Eliquis later today -Hemoglobin stable, currently 12.2, continue to monitor  Essential hypertension -Continue losartan, metoprolol  Hypothyroidism -Continue Synthroid  Nonspecific arthritis -Patient currently on prednisone with tapering dose of 5 mg every other day  Deconditioning -PT and OT pending  ?  Dementia/memory impairment -No formal diagnosis of dementia however patient is unable to answer all  questions appropriately -Currently alert and oriented to self and place  Sore throat/congestion -Will order Chloraseptic spray as well Cepacol, nasal spray  DVT Prophylaxis Heparin  Code Status: Full  Family Communication: None at bedside  Disposition Plan:  Status is: Inpatient   The patient will require care spanning > 2 midnights and should be moved to inpatient because: IV treatments appropriate due to intensity of illness or inability to take PO and Inpatient level of care appropriate due to severity of illness  Dispo: The patient is from: Home              Anticipated d/c is to: Home vs SNF- pending PT/OT               Anticipated d/c date is: 1 day              Patient currently is not medically stable to d/c.    Consultants None  Procedures  Echocardiogram Lower extremity doppler   Antibiotics   Anti-infectives (From admission, onward)   None      Subjective:   Catherine Hess seen and examined today.  Complains of feeling congested and have sore throat.  Denies chest pain or abdominal pain, N/V/D/C.  Feels anxious and has some shortness of breath with movement.  Denies current dizziness or headache.  States that she would like to be changed as she feels that she had an accident on herself.   Objective:   Vitals:   06/07/20 1946 06/07/20 2026 06/08/20 0137 06/08/20 0818  BP:  132/89  (!) 153/70  Pulse:  77  87  Resp:  18  20  Temp: 97.8 F (36.6 C) 98.3 F (36.8 C)  98.5 F (36.9 C)  TempSrc:  Oral  Oral  SpO2:  95%  93%  Weight:  66.4 kg 66.4 kg   Height:        Intake/Output Summary (Last 24 hours) at 06/08/2020 1230 Last data filed at 06/08/2020 1016 Gross per 24 hour  Intake 406.6 ml  Output 650 ml  Net -243.4 ml   Filed Weights   06/06/20 2004 06/07/20 2026 06/08/20 0137  Weight: 65.8 kg 66.4 kg 66.4 kg   Exam  General: Well developed, well nourished, NAD, appears stated age  HEENT: NCAT, mucous membranes moist.   Cardiovascular: S1  S2 auscultated, RRR  Respiratory: Clear to auscultation bilaterally   Abdomen: Soft, nontender, nondistended, + bowel sounds  Extremities: warm dry without cyanosis clubbing or edema  Neuro: AAOx2 (self, place), nonfocal  Psych: anxious, however appropriate  Data Reviewed: I have personally reviewed following labs and imaging studies  CBC: Recent Labs  Lab 06/06/20 1229 06/07/20 0326 06/08/20 0439  WBC 8.8 7.7 7.0  HGB 12.7 13.5 12.2  HCT 40.1 41.9 37.3  MCV 100.8* 100.0 97.6  PLT 202 174 192   Basic Metabolic Panel: Recent Labs  Lab 06/06/20 1229 06/07/20 0126  NA 137 138  K 4.5 3.7  CL 103 102  CO2 24 27  GLUCOSE 117* 124*  BUN 12 12  CREATININE 0.79 0.80  CALCIUM 9.9 9.3   GFR: Estimated Creatinine Clearance: 40.2 mL/min (by C-G formula based on SCr of 0.8 mg/dL). Liver Function Tests: No results for input(s): AST, ALT, ALKPHOS, BILITOT, PROT, ALBUMIN in the last 168 hours. No results for input(s): LIPASE, AMYLASE in the last 168 hours. No results for input(s): AMMONIA in the last 168 hours. Coagulation Profile: No results for input(s): INR, PROTIME in the last 168 hours. Cardiac Enzymes: No results for input(s): CKTOTAL, CKMB, CKMBINDEX, TROPONINI in the last 168 hours. BNP (last 3 results) No results for input(s): PROBNP in the last 8760 hours. HbA1C: No results for input(s): HGBA1C in the last 72 hours. CBG: No results for input(s): GLUCAP in the last 168 hours. Lipid Profile: No results for input(s): CHOL, HDL, LDLCALC, TRIG, CHOLHDL, LDLDIRECT in the last 72 hours. Thyroid Function Tests: No results for input(s): TSH, T4TOTAL, FREET4, T3FREE, THYROIDAB in the last 72 hours. Anemia Panel: No results for input(s): VITAMINB12, FOLATE, FERRITIN, TIBC, IRON, RETICCTPCT in the last 72 hours. Urine analysis:    Component Value Date/Time   COLORURINE YELLOW 06/06/2020 1227   APPEARANCEUR CLEAR 06/06/2020 1227   LABSPEC 1.010 06/06/2020 1227    PHURINE 7.0 06/06/2020 1227   GLUCOSEU NEGATIVE 06/06/2020 1227   HGBUR NEGATIVE 06/06/2020 1227   BILIRUBINUR NEGATIVE 06/06/2020 1227   KETONESUR NEGATIVE 06/06/2020 1227   PROTEINUR NEGATIVE 06/06/2020 1227   NITRITE NEGATIVE 06/06/2020 1227   LEUKOCYTESUR NEGATIVE 06/06/2020 1227   Sepsis Labs: (procalcitonin:4,lacticidven:4)  ) Recent Results (from the past 240 hour(s))  SARS Coronavirus 2 by RT PCR (hospital order, performed in [facility] hospital lab) Nasopharyngeal Nasopharyngeal Swab     Status: None   Collection Time: 06/06/20  7:58 PM   Specimen: Nasopharyngeal Swab  Result Value Ref Range Status   SARS Coronavirus 2 NEGATIVE NEGATIVE Final    Comment: (NOTE) SARS-CoV-2 target nucleic acids are NOT DETECTED.  The SARS-CoV-2 RNA is generally detectable in upper and lower respiratory specimens during the acute phase of infection. The lowest concentration of SARS-CoV-2 viral copies this assay can detect is 250 copies / mL. A negative result does not preclude SARS-CoV-2 infection and should  not be used as the sole basis for treatment or other patient management decisions.  A negative result may occur with improper specimen collection / handling, submission of specimen other than nasopharyngeal swab, presence of viral mutation(s) within the areas targeted by this assay, and inadequate number of viral copies (<250 copies / mL). A negative result must be combined with clinical observations, patient history, and epidemiological information.  Fact Sheet for Patients:   BoilerBrush.com.cy  Fact Sheet for Healthcare Providers: https://pope.com/  This test is not yet approved or  cleared by the [place] FDA and has been authorized for detection and/or diagnosis of SARS-CoV-2 by FDA under an Emergency Use Authorization (EUA).  This EUA will remain in effect (meaning this test can be used) for the duration of  the COVID-19 declaration under Section 564(b)(1) of the Act, 21 U.S.C. section 360bbb-3(b)(1), unless the authorization is terminated or revoked sooner.  Performed at [facility], [address], [place], [place] [address]       Radiology Studies: CT Angio Chest PE W/Cm &/Or Wo Cm  Addendum Date: 06/07/2020   ADDENDUM REPORT: 06/07/2020 00:35 ADDENDUM: These results were called by telephone at the time of interpretation on 06/07/2020 at 12:35 am to [facility], who verbally acknowledged these results. Electronically Signed   By: Elgie Collard M.D.   On: 06/07/2020 00:35   Result Date: 06/07/2020 CLINICAL DATA:  86 year old female with positive D-dimer. Concern for pulmonary embolism. EXAM: CT ANGIOGRAPHY CHEST WITH CONTRAST TECHNIQUE: Multidetector CT imaging of the chest was performed using the standard protocol during bolus administration of intravenous contrast. Multiplanar CT image reconstructions and MIPs were obtained to evaluate the vascular anatomy. CONTRAST:  75mL OMNIPAQUE IOHEXOL 350 MG/ML SOLN COMPARISON:  Chest radiograph dated 06/06/2020 and CT dated 11/22/2019. FINDINGS: Cardiovascular: There is no cardiomegaly or pericardial effusion. There is coronary vascular calcification and calcification of the mitral annulus. Moderate atherosclerotic calcification of the thoracic aorta. The aorta is tortuous. There are bilateral pulmonary artery emboli involving the right lower lobe segmental branch as well as lobar branch point in the left upper lobe which appear new since the prior CT. There is no CT evidence of right heart straining. Mediastinum/Nodes: No hilar or mediastinal adenopathy. The esophagus is grossly unremarkable. No mediastinal fluid collection. Lungs/Pleura: There is diffuse hazy airspace opacity which may represent atelectasis or areas of air trapping suggestive of underlying small airways versus small vessel disease. There is a 6 mm right lower lobe subpleural  nodule similar to prior CT. Additional 5 mm nodule in the right lower lobe appears similar. There is a 4 mm nodule in the right upper lobe (59/6) similar to prior CT. No lobar consolidation or pneumothorax. The central airways are patent. Upper Abdomen: No acute abnormality. Musculoskeletal: Osteopenia with degenerative changes of the spine. T10 compression fracture as seen previously. No acute osseous pathology. Review of the MIP images confirms the above findings. IMPRESSION: 1. Bilateral pulmonary artery emboli. No CT evidence of right heart straining. 2. Several nodules in the right lung measuring up to 6 mm. Non-contrast chest CT at 3-6 months is recommended. If the nodules are stable at time of repeat CT, then future CT at 18-24 months (from today's scan) is considered optional for low-risk patients, but is recommended for high-risk patients. This recommendation follows the consensus statement: Guidelines for Management of Incidental Pulmonary Nodules Detected on CT Images: From the Fleischner Society 2017; Radiology 2017; 284:228-243. 3. Aortic Atherosclerosis (ICD10-I70.0). Electronically Signed: By: Burtis Junes  Radparvar M.D. On: 06/07/2020 00:13   DG Chest Port 1 View  Result Date: 06/06/2020 CLINICAL DATA:  Cough. EXAM: PORTABLE CHEST 1 VIEW COMPARISON:  11/21/2019 chest radiograph and prior studies FINDINGS: Cardiomegaly and mild pulmonary vascular congestion noted. Increased LEFT LOWER lung opacity may represent airspace disease or atelectasis. Question trace bilateral pleural effusions. No pneumothorax or acute bony abnormality. IMPRESSION: 1. LEFT LOWER lung opacity which may represent atelectasis versus airspace disease/pneumonia. 2. Cardiomegaly with mild pulmonary vascular congestion and question trace bilateral pleural effusions. Electronically Signed   By: Harmon Pier M.D.   On: 06/06/2020 20:27   ECHOCARDIOGRAM COMPLETE  Result Date: 06/07/2020    ECHOCARDIOGRAM REPORT   Patient Name:    Catherine Hess Date of Exam: 06/07/2020 Medical Rec #:  [id number]       Height:       63.0 in Accession #:    [id number]      Weight:       145.0 lb Date of Birth:  [birth year]        BSA:          1.687 m Patient Age:    86 years        BP:           127/71 mmHg Patient Gender: F               HR:           77 bpm. Exam Location:  Inpatient Procedure: 2D Echo, Cardiac Doppler and Color Doppler Indications:    I26.02 Pulmonary embolus  History:        Patient has no prior history of Echocardiogram examinations.                 Risk Factors:Hypertension. Hypothyroidism.  Sonographer:    Elmarie Shiley Dance Referring Phys: [id number] ARSHAD N KAKRAKANDY IMPRESSIONS  1. Left ventricular ejection fraction, by estimation, is 65 to 70%. The left ventricle has normal function. The left ventricle has no regional wall motion abnormalities. There is moderate left ventricular hypertrophy. Left ventricular diastolic parameters are consistent with Grade I diastolic dysfunction (impaired relaxation). Elevated left ventricular end-diastolic pressure.  2. Right ventricular systolic function is normal. The right ventricular size is normal. There is normal pulmonary artery systolic pressure. The estimated right ventricular systolic pressure is 19.3 mmHg.  3. The mitral valve is abnormal. Trivial mitral valve regurgitation.  4. The aortic valve is tricuspid. Aortic valve regurgitation is mild. Mild aortic valve sclerosis is present, with no evidence of aortic valve stenosis.  5. The inferior vena cava is normal in size with greater than 50% respiratory variability, suggesting right atrial pressure of 3 mmHg. Conclusion(s)/Recommendation(s): Findings consistent with no evidence of right heart strain. FINDINGS  Left Ventricle: Left ventricular ejection fraction, by estimation, is 65 to 70%. The left ventricle has normal function. The left ventricle has no regional wall motion abnormalities. The left ventricular internal cavity size was normal in  size. There is  moderate left ventricular hypertrophy. Left ventricular diastolic parameters are consistent with Grade I diastolic dysfunction (impaired relaxation). Elevated left ventricular end-diastolic pressure. Right Ventricle: The right ventricular size is normal. No increase in right ventricular wall thickness. Right ventricular systolic function is normal. There is normal pulmonary artery systolic pressure. The tricuspid regurgitant velocity is 2.02 m/s, and  with an assumed right atrial pressure of 3 mmHg, the estimated right ventricular systolic pressure is 19.3 mmHg. Left Atrium: Left atrial size was normal in size. Right  Atrium: Right atrial size was normal in size. Pericardium: There is no evidence of pericardial effusion. Mitral Valve: The mitral valve is abnormal. Mild mitral annular calcification. Trivial mitral valve regurgitation. Tricuspid Valve: The tricuspid valve is grossly normal. Tricuspid valve regurgitation is trivial. Aortic Valve: The aortic valve is tricuspid. Aortic valve regurgitation is mild. Aortic regurgitation PHT measures 490 msec. Mild aortic valve sclerosis is present, with no evidence of aortic valve stenosis. Pulmonic Valve: The pulmonic valve was normal in structure. Pulmonic valve regurgitation is not visualized. Aorta: The aortic root and ascending aorta are structurally normal, with no evidence of dilitation. Venous: The inferior vena cava is normal in size with greater than 50% respiratory variability, suggesting right atrial pressure of 3 mmHg. IAS/Shunts: No atrial level shunt detected by color flow Doppler.  LEFT VENTRICLE PLAX 2D LVIDd:         3.50 cm  Diastology LVIDs:         2.14 cm  LV e' lateral:   3.92 cm/s LV PW:         1.40 cm  LV E/e' lateral: 18.1 LV IVS:        1.21 cm  LV e' medial:    3.15 cm/s LVOT diam:     1.80 cm  LV E/e' medial:  22.5 LV SV:         66 LV SV Index:   39 LVOT Area:     2.54 cm  RIGHT VENTRICLE             IVC RV Basal diam:  2.28  cm     IVC diam: 1.54 cm RV S prime:     11.50 cm/s TAPSE (M-mode): 1.6 cm LEFT ATRIUM             Index       RIGHT ATRIUM           Index LA diam:        2.70 cm 1.60 cm/m  RA Area:     10.30 cm LA Vol (A2C):   33.5 ml 19.86 ml/m RA Volume:   21.40 ml  12.69 ml/m LA Vol (A4C):   19.6 ml 11.62 ml/m LA Biplane Vol: 27.0 ml 16.01 ml/m  AORTIC VALVE LVOT Vmax:   152.00 cm/s LVOT Vmean:  90.400 cm/s LVOT VTI:    0.260 m AI PHT:      490 msec  AORTA Ao Root diam: 3.40 cm Ao Asc diam:  3.40 cm MITRAL VALVE                TRICUSPID VALVE MV Area (PHT): 1.86 cm     TV Peak grad:   17.3 mmHg MV Decel Time: 408 msec     TV Vmax:        2.08 m/s MV E velocity: 71.00 cm/s   TR Peak grad:   16.3 mmHg MV A velocity: 133.00 cm/s  TR Vmax:        202.00 cm/s MV E/A ratio:  0.53                             SHUNTS                             Systemic VTI:  0.26 m  Systemic Diam: 1.80 cm Zoila Shutter MD Electronically signed by Zoila Shutter MD Signature Date/Time: 06/07/2020/12:47:28 PM    Final    VAS [place] LOWER EXTREMITY VENOUS (DVT) (ONLY [facility] & [facility])  Result Date: 06/07/2020  Lower Venous DVTStudy Indications: Swelling, and History of LLE DVT on 11/22/19.  Performing Technologist: Marilynne Halsted RDMS, RVT  Examination Guidelines: A complete evaluation includes B-mode imaging, spectral Doppler, color Doppler, and power Doppler as needed of all accessible portions of each vessel. Bilateral testing is considered an integral part of a complete examination. Limited examinations for reoccurring indications may be performed as noted. The reflux portion of the exam is performed with the patient in reverse Trendelenburg.  +---------+---------------+---------+-----------+----------+--------------+ RIGHT    CompressibilityPhasicitySpontaneityPropertiesThrombus Aging +---------+---------------+---------+-----------+----------+--------------+ CFV      Full           Yes      Yes                                  +---------+---------------+---------+-----------+----------+--------------+ SFJ      Full                                                        +---------+---------------+---------+-----------+----------+--------------+ FV Prox  Full                                                        +---------+---------------+---------+-----------+----------+--------------+ FV Mid   Partial                                      Chronic        +---------+---------------+---------+-----------+----------+--------------+ FV DistalPartial        Yes      Yes                  Chronic        +---------+---------------+---------+-----------+----------+--------------+ PFV      Full                                                        +---------+---------------+---------+-----------+----------+--------------+ POP      None           No       No                   Chronic        +---------+---------------+---------+-----------+----------+--------------+ PTV      Partial                                      Chronic        +---------+---------------+---------+-----------+----------+--------------+ PERO     Partial  Chronic        +---------+---------------+---------+-----------+----------+--------------+   +---------+---------------+---------+-----------+----------+--------------+ LEFT     CompressibilityPhasicitySpontaneityPropertiesThrombus Aging +---------+---------------+---------+-----------+----------+--------------+ CFV      Full           Yes      Yes                                 +---------+---------------+---------+-----------+----------+--------------+ SFJ      Full                                                        +---------+---------------+---------+-----------+----------+--------------+ FV Prox  Full                                                         +---------+---------------+---------+-----------+----------+--------------+ FV Mid   Full                                                        +---------+---------------+---------+-----------+----------+--------------+ FV DistalFull                                                        +---------+---------------+---------+-----------+----------+--------------+ PFV      Full           Yes      Yes                                 +---------+---------------+---------+-----------+----------+--------------+ POP      Full                                                        +---------+---------------+---------+-----------+----------+--------------+ PTV      Partial                                      Chronic        +---------+---------------+---------+-----------+----------+--------------+ PERO     Partial                                      Chronic        +---------+---------------+---------+-----------+----------+--------------+     Summary: RIGHT: - Findings consistent with chronic deep vein thrombosis involving the right femoral vein, right popliteal vein, right peroneal veins, and right posterior tibial veins. - No cystic structure found in the popliteal fossa.  LEFT: - Findings consistent with chronic deep vein thrombosis involving the left posterior tibial veins,  and left peroneal veins. - Findings appear improved from previous examination. - A cystic structure is found in the popliteal fossa. - Baker's cyst 3.8 x 3.5 x 1.2 cm.  *See table(s) above for measurements and observations. Electronically signed by Fabienne Bruns MD on 06/07/2020 at 5:14:02 PM.    Final      Scheduled Meds: . ascorbic acid  500 mg Oral Daily  . calcium-vitamin D   Oral QHS  . levothyroxine  100 mcg Oral QAC breakfast  . losartan  25 mg Oral Daily  . metoprolol tartrate  12.5 mg Oral BID  . multivitamin with minerals  1 tablet Oral Daily  . pantoprazole  40 mg Oral Daily  .  predniSONE  5 mg Oral QODAY  . simvastatin  20 mg Oral QHS  . vitamin E  100 Units Oral Daily   Continuous Infusions: . heparin 1,000 Units/hr (06/07/20 2041)     LOS: 1 day   Time Spent in minutes   30 minutes  Sherri Mcarthy D.O. on 06/08/2020 at 12:30 PM  Between 7am to 7pm - Please see pager noted on [email or web address]  After 7pm go to [email or web address]  And look for the night coverage person covering for me after hours  [facility] Office  [phone]

## 2020-06-08 NOTE — Evaluation (Signed)
Occupational Therapy Evaluation Patient Details Name: Catherine Hess MRN: [id number] DOB: Feb 24, 1925 Today's Date: 06/08/2020    History of Present Illness Catherine Hess is a 86 y.o. female with history of hypertension, hypothyroidism admitted in February 2021 of this year for saddle embolus and right lower extremity DVT was on anticoagulation for 6 months and discontinued recently was found to be increasingly short of breath last few days particularly on exertion with some wheezing. Pt found to have pulmonary embolism   Clinical Impression   PTA, pt lives with daughter and reports having an aide that stays with her while daughter works. Pt reports assistance with IADLs and some ADLs at baseline. Pt very anxious with a fear of falling during session, repeatedly telling therapist, "hold on to me!" though no overt LOB noted. Pt requires grossly Min A for mobility with RW and noted crepitation in B LE with movement. Pt overall Min A for UB ADLs and Max A for LB ADLs. If pt's PLOF recall is accurate, pt is not far from baseline. If 24/7 assist available at home as pt reports, recommend HHOT follow-up.     Follow Up Recommendations  Supervision/Assistance - 24 hour;Home health OT;Other (comment) (SNF if 24/7 assist not available)    Equipment Recommendations  None recommended by OT (appears well equipped)    Recommendations for Other Services       Precautions / Restrictions Precautions Precautions: Fall Restrictions Weight Bearing Restrictions: No      Mobility Bed Mobility Overal bed mobility: Needs Assistance Bed Mobility: Supine to Sit;Sit to Supine     Supine to sit: Min assist Sit to supine: Supervision   General bed mobility comments: Min A to advance trunk to EOB with HHA from therapist, no assist required to return to bed  Transfers Overall transfer level: Needs assistance Equipment used: Rolling walker (2 wheeled) Transfers: Sit to/from [employer] Sit to Stand: Min assist Stand pivot transfers: Min assist       General transfer comment: Varying from Min A to min guard at times, requires consistent cues for sequencing and assistance to manuever RW when turning    Balance Overall balance assessment: Needs assistance Sitting-balance support: Bilateral upper extremity supported;Feet supported Sitting balance-Leahy Scale: Fair     Standing balance support: Bilateral upper extremity supported;During functional activity Standing balance-Leahy Scale: Poor Standing balance comment: relaint on B UE on RW, grab bars in bathroom. No obvious LOB but pt very anxious and fearful of falling                           ADL either performed or assessed with clinical judgement   ADL Overall ADL's : Needs assistance/impaired;At baseline Eating/Feeding: Set up;Sitting   Grooming: Set up;Sitting   Upper Body Bathing: Minimal assistance;Sitting   Lower Body Bathing: Moderate assistance;Sit to/from stand;Sitting/lateral leans   Upper Body Dressing : Minimal assistance;Sitting Upper Body Dressing Details (indicate cue type and reason): Min A to don hospital gown around back sitting EOB Lower Body Dressing: Maximal assistance;Sit to/from stand Lower Body Dressing Details (indicate cue type and reason): Max A for donning socks, clothing mgmt at toilet, reports she has assistance with this at home Toilet Transfer: Minimal assistance;Ambulation;Regular Toilet;BSC;RW;Cueing for safety;Cueing for sequencing Toilet Transfer Details (indicate cue type and reason): Min A overall for mobility to/from bathroom. Very poor problem solving and following safety instructions to manuever RW in small bathroom Toileting- Clothing Manipulation and Hygiene: Moderate  assistance;Sit to/from stand Toileting - Clothing Manipulation Details (indicate cue type and reason): Mod A overall. Pt able to perform anterior peri care sitting on toilet. Required  assistance for brief mgmt     Functional mobility during ADLs: Minimal assistance;Cueing for safety;Cueing for sequencing;Rolling walker General ADL Comments: Pt appears close to baseline for ADLs     Vision Patient Visual Report: No change from baseline Vision Assessment?: No apparent visual deficits     Perception     Praxis      Pertinent Vitals/Pain Pain Assessment: Faces Faces Pain Scale: Hurts little more Pain Location: B LEs, arthritis (audible crepitation) Pain Descriptors / Indicators: Discomfort;Grimacing Pain Intervention(s): Monitored during session;Limited activity within patient's tolerance     Hand Dominance Right   Extremity/Trunk Assessment Upper Extremity Assessment Upper Extremity Assessment: Generalized weakness   Lower Extremity Assessment Lower Extremity Assessment: Defer to PT evaluation   Cervical / Trunk Assessment Cervical / Trunk Assessment: Kyphotic   Communication Communication Communication: HOH   Cognition Arousal/Alertness: Awake/alert Behavior During Therapy: Anxious;Restless Overall Cognitive Status: History of cognitive impairments - at baseline                                 General Comments: pt with cognitive deficits at baseline, reports she is in her 56s (actually 86 y/o), but confirms her birthday. High anxiety and fear of falling.    General Comments  On entry, pt perseverating on obtaining nasal spray (RN aware) and very anxious. able to be redirected when reading O2 vitals at 92% on RA and HR WFL.     Exercises     Shoulder Instructions      Home Living Family/patient expects to be discharged to:: Private residence Living Arrangements: Children (daughter) Available Help at Discharge: Family;Personal care attendant (daughter works, but has an Engineer, production that assists during day) Type of Home: House Home Access: Stairs to enter [employer] of Steps: 3 Entrance Stairs-Rails: None Home Layout: One  level     Bathroom Shower/Tub: Chief Strategy Officer: Handicapped height     Home Equipment: Environmental consultant - 4 wheels;Walker - 2 wheels;Tub bench;Hospital bed;Wheelchair - manual          Prior Functioning/Environment Level of Independence: Needs assistance  Gait / Transfers Assistance Needed: rollator for mobility. Assist for stair management. ADL's / Homemaking Assistance Needed: assisted for meals, housekeeping and showering from aide/family. Pt reports occasional assist with toileting/dressing tasks   Comments: Pt with varying answers to questions, poor historian but most information from previous hospitalization that match        OT Problem List: Decreased strength;Decreased activity tolerance;Impaired balance (sitting and/or standing);Decreased cognition;Decreased safety awareness;Decreased knowledge of use of DME or AE      OT Treatment/Interventions: Self-care/ADL training;Therapeutic exercise;Energy conservation;DME and/or AE instruction;Therapeutic activities;Patient/family education    OT Goals(Current goals can be found in the care plan section) Acute Rehab OT Goals Patient Stated Goal: get my nasal spray so I can breathe OT Goal Formulation: With patient Time For Goal Achievement: 06/22/20 Potential to Achieve Goals: Good ADL Goals Pt Will Perform Grooming: with modified independence;standing Pt Will Transfer to Toilet: with modified independence;bedside commode;regular height toilet;ambulating Pt Will Perform Toileting - Clothing Manipulation and hygiene: with supervision;sit to/from stand;sitting/lateral leans Additional ADL Goal #1: Pt to verbalize at least 3 fall prevention strategies to ease anxiety and reduce risk for falls  OT Frequency: Min 2X/week  Barriers to D/C:            Co-evaluation              AM-PAC OT "6 Clicks" Daily Activity     Outcome Measure Help from another person eating meals?: A Little Help from another person  taking care of personal grooming?: A Little Help from another person toileting, which includes using toliet, bedpan, or urinal?: A Lot Help from another person bathing (including washing, rinsing, drying)?: A Lot Help from another person to put on and taking off regular upper body clothing?: A Little Help from another person to put on and taking off regular lower body clothing?: A Lot 6 Click Score: 15   End of Session Equipment Utilized During Treatment: Gait belt;Rolling walker Nurse Communication: Mobility status  Activity Tolerance: Other (comment) (limited by anxiety) Patient left: in bed;with call bell/phone within reach;with bed alarm set;Other (comment) (with lab tech)  OT Visit Diagnosis: Unsteadiness on feet (R26.81);Other abnormalities of gait and mobility (R26.89);Muscle weakness (generalized) (M62.81)                Time: 0300-9233 OT Time Calculation (min): 35 min Charges:  OT General Charges $OT Visit: 1 Visit OT Evaluation $OT Eval Moderate Complexity: 1 Mod OT Treatments $Self Care/Home Management : 8-22 mins  Lorre Munroe, OTR/L  Lorre Munroe 06/08/2020, 12:52 PM

## 2020-06-08 NOTE — Progress Notes (Signed)
ANTICOAGULATION CONSULT NOTE - Follow Up Consult  Pharmacy Consult for heparin Indication: pulmonary embolus  Labs: Recent Labs    06/06/20 1229 06/06/20 1229 06/06/20 1958 06/07/20 0126 06/07/20 0326 06/07/20 0950 06/07/20 1800 06/08/20 0439  HGB 12.7   < >  --   --  13.5  --   --  12.2  HCT 40.1  --   --   --  41.9  --   --  37.3  PLT 202  --   --   --  174  --   --  192  HEPARINUNFRC  --   --   --   --   --  0.65 0.78* 0.61  CREATININE 0.79  --   --  0.80  --   --   --   --   TROPONINIHS 14   < > 15 17 18*  --   --   --    < > = values in this interval not displayed.    Assessment/Plan:  86yo female therapeutic on heparin after rate change. Will continue gtt at current rate and confirm stable with additional level.   Vernard Gambles, PharmD, BCPS  06/08/2020,5:09 AM

## 2020-06-08 NOTE — Plan of Care (Signed)

## 2020-06-08 NOTE — Progress Notes (Signed)
ANTICOAGULATION CONSULT NOTE  Pharmacy Consult for Heparin >> Eliquis Indication: pulmonary embolus  Allergies  Allergen Reactions  . Azithromycin Swelling    Patient Measurements: Height: 5\' 3"  (160 cm) Weight: 66.4 kg (146 lb 6.2 oz) IBW/kg (Calculated) : 52.4   Vital Signs: Temp: 98.5 F (36.9 C) (08/24 0818) Temp Source: Oral (08/24 0818) BP: 153/70 (08/24 0818) Pulse Rate: 87 (08/24 0818)  Labs: Recent Labs    06/06/20 1229 06/06/20 1229 06/06/20 1958 06/07/20 0126 06/07/20 0326 06/07/20 0950 06/07/20 1800 06/08/20 0439 06/08/20 1235  HGB 12.7   < >  --   --  13.5  --   --  12.2  --   HCT 40.1  --   --   --  41.9  --   --  37.3  --   PLT 202  --   --   --  174  --   --  192  --   HEPARINUNFRC  --   --   --   --   --    < > 0.78* 0.61 0.45  CREATININE 0.79  --   --  0.80  --   --   --   --   --   TROPONINIHS 14   < > 15 17 18*  --   --   --   --    < > = values in this interval not displayed.    Estimated Creatinine Clearance: 40.2 mL/min (by C-G formula based on SCr of 0.8 mg/dL).   Assessment: 86 yo female presents with wheezing and cough. The patient was on apixaban for history of PE back in Feb 2021 with the original plan being for 6 months of therapy but treatment appears to have ended in July.   CT scan shows a new bilateral PE without evidence of heart strain. Pharmacy now consulted to transition from IV heparin to Eliquis.  No bleeding reported.  Goal of Therapy:  Appropriate anticoagulation Monitor platelets by anticoagulation protocol: Yes   Plan:  Eliquis 10mg  PO BID x 7 days, then on 06/16/11 PM start 5 mg PO BID D/C IV heparin when Eliquis is administered Pharmacy will sign off and follow peripherally.  Thank you for the consult!  Shuntay Everetts D. , PharmD, BCPS, BCCCP 06/08/2020, 2:43 PM

## 2020-06-08 NOTE — Plan of Care (Signed)

## 2020-06-09 NOTE — NC FL2 (Signed)
[place] MEDICAID FL2 LEVEL OF CARE SCREENING TOOL     IDENTIFICATION  Patient Name: Catherine Hess Birthdate: 1924-12-30 Sex: female Admission Date (Current Location): 06/06/2020  [facility] and [place] Number:  Producer, television/film/video and Address:  [facility] [place], [address], [place], [place] [address]      Provider Number: [id number]  Attending Physician Name and Address:  Kathlen Mody, MD  Relative Name and Phone Number:  Domingo Cocking, [phone]    Current Level of Care: Hospital Recommended Level of Care: Skilled Nursing Facility Prior Approval Number:    Date Approved/Denied:   PASRR Number:    Discharge Plan: SNF    Current Diagnoses: Patient Active Problem List   Diagnosis Date Noted  . Acute pulmonary embolism (HCC) 06/07/2020  . Leg DVT (deep venous thromboembolism), acute, left (HCC)   . 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 Acute GI bleeding 04/05/2019  . Essential hypertension 04/05/2019  . Hypothyroidism 04/05/2019    Orientation RESPIRATION BLADDER Height & Weight     Self, Time, Situation, Place  Normal Continent Weight: 147 lb 14.9 oz (67.1 kg) Height:  5\' 3"  (160 cm)  BEHAVIORAL SYMPTOMS/MOOD NEUROLOGICAL BOWEL NUTRITION STATUS      Continent Diet (heart diet, see discharge summary)  AMBULATORY STATUS COMMUNICATION OF NEEDS Skin   Limited Assist Verbally Other (Comment) (abrasion)                       Personal Care Assistance Level of Assistance  Bathing, Feeding, Dressing Bathing Assistance: Limited assistance Feeding assistance: Limited assistance Dressing Assistance: Maximum assistance     Functional Limitations Info  Sight, Hearing, Speech Sight Info: Adequate Hearing Info: Adequate Speech Info: Adequate    SPECIAL CARE FACTORS FREQUENCY  PT (By licensed PT), OT (By licensed OT)     PT Frequency: 5x week OT Frequency: 5x week            Contractures Contractures Info: Not  present    Additional Factors Info  Code Status, Allergies Code Status Info: full Allergies Info: Azithromycin           Current Medications (06/09/2020):  This is the current hospital active medication list Current Facility-Administered Medications  Medication Dose Route Frequency Provider Last Rate Last Admin  . apixaban (ELIQUIS) tablet 10 mg  10 mg Oral BID 06/11/2020, RPH   10 mg at 06/09/20 06/11/20   Followed by  . [START ON 06/15/2020] apixaban (ELIQUIS) tablet 5 mg  5 mg Oral BID 06/17/2020, [facility]      . ascorbic acid (VITAMIN C) tablet 500 mg  500 mg Oral Daily [facility], MD   500 mg at 06/09/20 06/11/20  . calcium-vitamin D (OSCAL WITH D) 500-200 MG-UNIT per tablet   Oral QHS 6283, MD   1 tablet at 06/08/20 2106  . levothyroxine (SYNTHROID) tablet 100 mcg  100 mcg Oral QAC breakfast 2107, MD   100 mcg at 06/09/20 0526  . losartan (COZAAR) tablet 25 mg  25 mg Oral Daily 06/11/20, MD   25 mg at 06/09/20 06/11/20  . menthol-cetylpyridinium (CEPACOL) lozenge 3 mg  1 lozenge Oral PRN 1517, DO   3 mg at 06/08/20 1316  . metoprolol tartrate (LOPRESSOR) tablet 12.5 mg  12.5 mg Oral BID 06/10/20, MD   12.5 mg at 06/09/20 06/11/20  . multivitamin with minerals tablet 1 tablet  1 tablet Oral Daily Eduard Clos, MD   1 tablet at 06/09/20 [id number]  . ondansetron (ZOFRAN) tablet 4 mg  4 mg Oral Q6H PRN Eduard Clos, MD       Or  . ondansetron [facility]) injection 4 mg  4 mg Intravenous Q6H PRN Eduard Clos, MD      . pantoprazole (PROTONIX) EC tablet 40 mg  40 mg Oral Daily Eduard Clos, MD   40 mg at 06/09/20 0459  . phenol (CHLORASEPTIC) mouth spray 1 spray  1 spray Mouth/Throat PRN Mikhail, Nita Sells, DO      . predniSONE (DELTASONE) tablet 5 mg  5 mg Oral QODAY Eduard Clos, MD   5 mg at 06/09/20 9774  . simvastatin (ZOCOR) tablet 20 mg  20 mg Oral QHS Eduard Clos, MD   20 mg at  06/08/20 2106  . sodium chloride (OCEAN) 0.65 % nasal spray 1 spray  1 spray Each Nare PRN Edsel Petrin, DO   1 spray at 06/08/20 1315  . vitamin E capsule 100 Units  100 Units Oral Daily Eduard Clos, MD   100 Units at 06/09/20 1423     Discharge Medications: Please see discharge summary for a list of discharge medications.  Relevant Imaging Results:  Relevant Lab Results:   Additional Information SSN [id number]  Lorri Frederick, LCSW

## 2020-06-09 NOTE — TOC Initial Note (Addendum)
Transition of Care [facility] - Initial/Assessment Note    Patient Details  Name: Catherine Hess MRN: [id number] Date of Birth: 06-29-1925  Transition of Care [facility]) CM/SW Contact:    Lorri Frederick, LCSW Phone Number: 06/09/2020, 9:28 AM  Clinical Narrative:  CSW spoke to pt about discharge recommendations of HH.  Pt lives with daughter and her family in [place].  Pt agreeable to [facility] recommendation, gave permission to speak to daughter, [facility] agencies.  Pt reports she has had therapy in the home before but could not remember the name of the company.  Pt reports she is not vaccinated.  CSW contacted pt daugher, Catherine Hess, who prefers SNF recommendation.  Pt does have supervision at home--aid comes during the day, family in the evening, but daughter thinks she would get more therapy at SNF.  Contacted PT about recommendation.            1200: PT agreeable to referral for SNF.  Pt sent out in hub.  Daughter requests additional sites: [facility] in [place]: [phone] spoke with Foye Clock, who does not have any beds available this week.  [facility] [phone].  Referral faxed to [facility] and receipt confirmed.         Expected Discharge Plan: Skilled Nursing Facility Barriers to Discharge: No SNF bed   Patient Goals and CMS Choice Patient states their goals for this hospitalization and ongoing recovery are:: get well, walk better CMS Medicare.gov Compare Post Acute Care list provided to:: Patient Choice offered to / list presented to : Patient  Expected Discharge Plan and Services Expected Discharge Plan: Skilled Nursing Facility     Post Acute Care Choice: Skilled Nursing Facility Living arrangements for the past 2 months: Single Family Home                                      Prior Living Arrangements/Services Living arrangements for the past 2 months: Single Family Home Lives with:: Adult Children Patient language and need for interpreter reviewed:: Yes Do you feel safe  going back to the place where you live?: Yes      Need for Family Participation in Patient Care: Yes (Comment) Care giver support system in place?: Yes (comment)   Criminal Activity/Legal Involvement Pertinent to Current Situation/Hospitalization: No - Comment as needed  Activities of Daily Living Home Assistive Devices/Equipment: None ADL Screening (condition at time of admission) Patient's cognitive ability adequate to safely complete daily activities?: No Is the patient deaf or have difficulty hearing?: No Does the patient have difficulty seeing, even when wearing glasses/contacts?: No Does the patient have difficulty concentrating, remembering, or making decisions?: Yes Patient able to express need for assistance with ADLs?: No Does the patient have difficulty dressing or bathing?: Yes Does the patient have difficulty walking or climbing stairs?: Yes  Permission Sought/Granted Permission sought to share information with : Facility Medical sales representative, Family Supports Permission granted to share information with : Yes, Verbal Permission Granted  Share Information with NAME: Catherine Hess, daughter  Permission granted to share info w AGENCY: SNF, HH        Emotional Assessment Appearance:: Appears stated age Attitude/Demeanor/Rapport: Engaged Affect (typically observed): Pleasant Orientation: : Oriented to Self, Oriented to Place, Oriented to Situation Alcohol / Substance Use: Not Applicable Psych Involvement: No (comment)  Admission diagnosis:  Pedal edema [R60.0] Acute pulmonary embolism (HCC) [I26.99] Patient Active Problem List   Diagnosis Date  Noted  . Acute pulmonary embolism (HCC) 06/07/2020  . Leg DVT (deep venous thromboembolism), acute, left (HCC)   . 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 Acute GI bleeding 04/05/2019  . Essential hypertension 04/05/2019  . Hypothyroidism 04/05/2019   PCP:  Tacy Learn, FNP Pharmacy:   [facility] - [place], [place]  - [address] [place] [address] Phone: [phone] Fax: [phone]  [facility] [place], [place] - [address] [place] [address] Phone: [phone] Fax: [phone]     Social Determinants of Health (SDOH) Interventions    Readmission Risk Interventions No flowsheet data found.

## 2020-06-09 NOTE — Progress Notes (Signed)
PROGRESS NOTE    Catherine Hess  ZOX:[id number] DOB: 1925/09/27 DOA: 06/06/2020 PCP: Tacy Learn, FNP    No chief complaint on file.   Brief Narrative:  86 year old with hypertension, hypothyroidism admitted for saddle embolus and right lower extremity DVT was on anticoagulation for 6 months and discontinued recently presents now with shortness of breath on exertion.  And she was found to have an acute DVT at this time.   Assessment & Plan:   Principal Problem:   Acute pulmonary embolism (HCC) Active Problems:   Essential hypertension   Hypothyroidism   Acute pulmonary embolism She was initially started on heparin and transitioned to Eliquis. Echocardiogram shows left ventricular ejection fraction of 65 to 70% with grade 1 diastolic dysfunction. Lower extremity duplex showed right chronic DVT involving right femoral, popliteal, peroneal and posterior tibial veins. Left lower extremity showed chronic DVT and cystic structure in the popliteal foci in the left.  Hemoglobin appears to be stable around 12.  Continue to monitor.  PT evaluation recommending SNF on discharge.    Essential hypertension Blood pressure parameters are optimal continue with metoprolol losartan.    Hypothyroidism Continue with Synthroid.   Dementia Patient appears to be oriented to place and person only.     DVT prophylaxis: Eliquis Code Status: (Full code Family Communication: (none at bedside.  Disposition:   Status is: Inpatient  Remains inpatient appropriate because:Unsafe d/c plan   Dispo: The patient is from: Home              Anticipated d/c is to: SNF              Anticipated d/c date is: 1 day              Patient currently is not medically stable to d/c.       Consultants:   None.    Procedures: none.    Antimicrobials:none.    Subjective: Does not feel too good.   Objective: Vitals:   06/08/20 2058 06/08/20 2146 06/09/20 0116 06/09/20 0821  BP: (!)  155/55   (!) 160/77  Pulse: 76   89  Resp: 16   17  Temp: 97.9 F (36.6 C)   98 F (36.7 C)  TempSrc: Oral   Oral  SpO2: 95%   92%  Weight:  67.1 kg 67.1 kg   Height:        Intake/Output Summary (Last 24 hours) at 06/09/2020 1428 Last data filed at 06/09/2020 1415 Gross per 24 hour  Intake 240 ml  Output 1650 ml  Net -1410 ml   Filed Weights   06/08/20 0137 06/08/20 2146 06/09/20 0116  Weight: 66.4 kg 67.1 kg 67.1 kg    Examination:  General exam: Appears calm and comfortable  Respiratory system: Clear to auscultation. Respiratory effort normal. Cardiovascular system: S1 & S2 heard, RRR. No JVD,  No pedal edema. Gastrointestinal system: Abdomen is nondistended, soft and nontender. . Normal bowel sounds heard. Central nervous system: Alert and oriented. No focal neurological deficits. Extremities: Symmetric 5 x 5 power. Skin: No rashes, lesions or ulcers Psychiatry: . Mood & affect appropriate.     Data Reviewed: I have personally reviewed following labs and imaging studies  CBC: Recent Labs  Lab 06/06/20 1229 06/07/20 0326 06/08/20 0439  WBC 8.8 7.7 7.0  HGB 12.7 13.5 12.2  HCT 40.1 41.9 37.3  MCV 100.8* 100.0 97.6  PLT 202 174 192    Basic Metabolic Panel: Recent Labs  Lab 06/06/20 1229 06/07/20 0126  NA 137 138  K 4.5 3.7  CL 103 102  CO2 24 27  GLUCOSE 117* 124*  BUN 12 12  CREATININE 0.79 0.80  CALCIUM 9.9 9.3    GFR: Estimated Creatinine Clearance: 40.4 mL/min (by C-G formula based on SCr of 0.8 mg/dL).  Liver Function Tests: No results for input(s): AST, ALT, ALKPHOS, BILITOT, PROT, ALBUMIN in the last 168 hours.  CBG: No results for input(s): GLUCAP in the last 168 hours.   Recent Results (from the past 240 hour(s))  SARS Coronavirus 2 by RT PCR (hospital order, performed in [facility] hospital lab) Nasopharyngeal Nasopharyngeal Swab     Status: None   Collection Time: 06/06/20  7:58 PM   Specimen: Nasopharyngeal Swab  Result  Value Ref Range Status   SARS Coronavirus 2 NEGATIVE NEGATIVE Final    Comment: (NOTE) SARS-CoV-2 target nucleic acids are NOT DETECTED.  The SARS-CoV-2 RNA is generally detectable in upper and lower respiratory specimens during the acute phase of infection. The lowest concentration of SARS-CoV-2 viral copies this assay can detect is 250 copies / mL. A negative result does not preclude SARS-CoV-2 infection and should not be used as the sole basis for treatment or other patient management decisions.  A negative result may occur with improper specimen collection / handling, submission of specimen other than nasopharyngeal swab, presence of viral mutation(s) within the areas targeted by this assay, and inadequate number of viral copies (<250 copies / mL). A negative result must be combined with clinical observations, patient history, and epidemiological information.  Fact Sheet for Patients:   BoilerBrush.com.cy  Fact Sheet for Healthcare Providers: https://pope.com/  This test is not yet approved or  cleared by the [place] FDA and has been authorized for detection and/or diagnosis of SARS-CoV-2 by FDA under an Emergency Use Authorization (EUA).  This EUA will remain in effect (meaning this test can be used) for the duration of the COVID-19 declaration under Section 564(b)(1) of the Act, 21 U.S.C. section 360bbb-3(b)(1), unless the authorization is terminated or revoked sooner.  Performed at [facility], [address], [place], [place] [address]          Radiology Studies: No results found.      Scheduled Meds: . apixaban  10 mg Oral BID   Followed by  . [START ON 06/15/2020] apixaban  5 mg Oral BID  . ascorbic acid  500 mg Oral Daily  . calcium-vitamin D   Oral QHS  . levothyroxine  100 mcg Oral QAC breakfast  . losartan  25 mg Oral Daily  . metoprolol tartrate  12.5 mg Oral BID  . multivitamin with  minerals  1 tablet Oral Daily  . pantoprazole  40 mg Oral Daily  . predniSONE  5 mg Oral QODAY  . simvastatin  20 mg Oral QHS  . vitamin E  100 Units Oral Daily   Continuous Infusions:   LOS: 2 days        Kathlen Mody, MD [facility]   To contact the attending provider between 7A-7P or the covering provider during after hours 7P-7A, please log into the web site [email or web address] and access using universal [place] password for that web site. If you do not have the password, please call the hospital operator.  06/09/2020, 2:28 PM

## 2020-06-09 NOTE — Progress Notes (Signed)
  Progress Note  This note is a follow up note addressing Ms Catherine Hess' family's desire for her to go to SNF rehab.  I am fully in favor of this change.  This patient was unable to give me reliable information of assistance resources at home.  She would definitely need 24/7 assist at this time and it appears that the family is unable to provide this care.  I'm recommending SNF for rehab at this time as a change to disposition. 06/09/2020  Jacinto Halim., PT Acute Rehabilitation Services [phone]  (pager) [phone]  (office)

## 2020-06-09 NOTE — Progress Notes (Signed)
Transitions of Care Pharmacist Note  Catherine Hess is a 86 y.o. female that has been diagnosed with DVT and PE and will be prescribed Eliquis (apixaban) at discharge.   Patient Education: I provided the following education on 8/25 to the patient and patient's daughter (helps manage medications): How to take the medication Described what the medication is Signs of bleeding Signs/symptoms of VTE and stroke  Answered their questions  Discharge Medications Plan: The patient is not interested in filling their discharge medications with the Transitions of Care pharmacy at this time.   If the patient later decides they would like to have the Transitions of Care pharmacy fill their discharge medications, please call us at [phone]. Thank you.    Thank you,   Laverna Peace, PharmD PGY-1 Ambulatory Care Pharmacy Resident 06/09/2020 7:35 PM

## 2020-06-09 NOTE — Discharge Instructions (Signed)
Information on my medicine - ELIQUIS (apixaban)  This medication education was reviewed with me or my healthcare representative as part of my discharge preparation.    Why was Eliquis prescribed for you? Eliquis was prescribed to treat blood clots that may have been found in the veins of your legs (deep vein thrombosis) or in your lungs (pulmonary embolism) and to reduce the risk of them occurring again.  What do You need to know about Eliquis ? The starting dose is 10 mg (two 5 mg tablets) taken TWICE daily for the FIRST SEVEN (7) DAYS, then on 06/15/20 the dose is reduced to ONE 5 mg tablet taken TWICE daily.  Eliquis may be taken with or without food.   Try to take the dose about the same time in the morning and in the evening. If you have difficulty swallowing the tablet whole please discuss with your pharmacist how to take the medication safely.  Take Eliquis exactly as prescribed and DO NOT stop taking Eliquis without talking to the doctor who prescribed the medication.  Stopping may increase your risk of developing a new blood clot.  Refill your prescription before you run out.  After discharge, you should have regular check-up appointments with your healthcare provider that is prescribing your Eliquis.    What do you do if you miss a dose? If a dose of ELIQUIS is not taken at the scheduled time, take it as soon as possible on the same day and twice-daily administration should be resumed. The dose should not be doubled to make up for a missed dose.  Important Safety Information A possible side effect of Eliquis is bleeding. You should call your healthcare provider right away if you experience any of the following: ? Bleeding from an injury or your nose that does not stop. ? Unusual colored urine (red or dark brown) or unusual colored stools (red or black). ? Unusual bruising for unknown reasons. ? A serious fall or if you hit your head (even if there is no bleeding).  Some  medicines may interact with Eliquis and might increase your risk of bleeding or clotting while on Eliquis. To help avoid this, consult your healthcare provider or pharmacist prior to using any new prescription or non-prescription medications, including herbals, vitamins, non-steroidal anti-inflammatory drugs (NSAIDs) and supplements.  This website has more information on Eliquis (apixaban): http://www.eliquis.com/eliquis/home  

## 2020-06-10 LAB — SARS CORONAVIRUS 2 BY RT PCR (HOSPITAL ORDER, PERFORMED IN ~~LOC~~ HOSPITAL LAB): SARS Coronavirus 2: NEGATIVE

## 2020-06-10 MED ORDER — APIXABAN 5 MG PO TABS: 10.0000 mg | ORAL_TABLET | Freq: Two times a day (BID) | ORAL | Status: AC

## 2020-06-10 MED ORDER — APIXABAN 5 MG PO TABS: 5.0000 mg | ORAL_TABLET | Freq: Two times a day (BID) | ORAL | 0 refills | Status: AC

## 2020-06-10 NOTE — TOC Transition Note (Deleted)
Transition of Care [facility]) - CM/SW Discharge Note   Patient Details  Name: Catherine Hess MRN: [id number] Date of Birth: 1925-01-01  [facility] CM/SW Contact:  Lorri Frederick, LCSW Phone Number: 06/10/2020, 1:17 PM   Clinical Narrative:   Pt going to room 205-2.  RN call report to [phone].    Final next level of care: Skilled Nursing Facility Barriers to Discharge: Barriers Resolved   Patient Goals and CMS Choice Patient states their goals for this hospitalization and ongoing recovery are:: get well, walk better CMS Medicare.gov Compare Post Acute Care list provided to:: Patient Choice offered to / list presented to : Patient  Discharge Placement              Patient chooses bed at: [facility] Patient to be transferred to facility by: [facility] Name of family member notified: Alesus Patient and family notified of of transfer: 06/10/20  Discharge Plan and Services     Post Acute Care Choice: Skilled Nursing Facility                               Social Determinants of Health (SDOH) Interventions     Readmission Risk Interventions No flowsheet data found.

## 2020-06-10 NOTE — Plan of Care (Signed)

## 2020-06-10 NOTE — Plan of Care (Signed)

## 2020-06-10 NOTE — Discharge Summary (Signed)
Physician Discharge Summary  KELLIANNE EK ZOX:[id number] DOB: Nov 14, 1924 DOA: 06/06/2020  PCP: Tacy Learn, FNP  Admit date: 06/06/2020 Discharge date: 06/10/2020  Admitted From: HOME.  Disposition:  SNF  Recommendations for Outpatient Follow-up:  1. Follow up with PCP in 1-2 weeks 2. Please obtain BMP/CBC in one week   Discharge Condition:STABLE.  CODE STATUS:FULL CODE.  Diet recommendation: Heart Healthy   Brief/Interim Summary: 86 year old with hypertension, hypothyroidism admitted for saddle embolus and right lower extremity DVT was on anticoagulation for 6 months and discontinued recently presents now with shortness of breath on exertion.  And she was found to have an acute DVT at this time.   Discharge Diagnoses:  Principal Problem:   Acute pulmonary embolism (HCC) Active Problems:   Essential hypertension   Hypothyroidism  Acute pulmonary embolism She was initially started on heparin and transitioned to Eliquis. She will be on  Echocardiogram shows left ventricular ejection fraction of 65 to 70% with grade 1 diastolic dysfunction. Lower extremity duplex showed right chronic DVT involving right femoral, popliteal, peroneal and posterior tibial veins. Left lower extremity showed chronic DVT and cystic structure in the popliteal foci in the left.  Hemoglobin appears to be stable around 12.  Continue to monitor.  PT evaluation recommending SNF on discharge.    Essential hypertension Blood pressure parameters are optimal continue with metoprolol losartan.    Hypothyroidism Continue with Synthroid.   Dementia Patient appears to be oriented to place and person only  Discharge Instructions  Discharge Instructions    Diet - low sodium heart healthy   Complete by: As directed    Discharge instructions   Complete by: As directed    Please follow up with PCP in one week.     Allergies as of 06/10/2020      Reactions   Azithromycin Swelling       Medication List    STOP taking these medications   calcium carbonate 600 MG Tabs tablet Commonly known as: Calcium 600     TAKE these medications   apixaban 5 MG Tabs tablet Commonly known as: ELIQUIS Take 2 tablets (10 mg total) by mouth 2 (two) times daily for 5 days. What changed: Another medication with the same name was changed. Make sure you understand how and when to take each.   apixaban 5 MG Tabs tablet Commonly known as: ELIQUIS Take 1 tablet (5 mg total) by mouth 2 (two) times daily. Start taking on: June 15, 2020 What changed: These instructions start on June 15, 2020. If you are unsure what to do until then, ask your doctor or other care provider.   ascorbic acid 500 MG tablet Commonly known as: VITAMIN C Take 500 mg by mouth daily.   CALCIUM 600-D PO Take 1 tablet by mouth at bedtime.   ELDERBERRY PO Take 2 tablets by mouth daily.   levothyroxine 100 MCG tablet Commonly known as: SYNTHROID Take 100 mcg by mouth daily before breakfast.   losartan 50 MG tablet Commonly known as: COZAAR Take 25 mg by mouth daily.   metoprolol tartrate 25 MG tablet Commonly known as: LOPRESSOR Take 12.5 mg by mouth 2 (two) times daily.   multivitamin with minerals Tabs tablet Take 1 tablet by mouth daily.   omeprazole 40 MG capsule Commonly known as: PRILOSEC Take 40 mg by mouth daily.   predniSONE 10 MG tablet Commonly known as: DELTASONE Take 5 mg by mouth every other day.   REFRESH TEARS OP Place 1  drop into both eyes 4 (four) times daily.   simvastatin 20 MG tablet Commonly known as: ZOCOR Take 20 mg by mouth at bedtime.   vitamin E 45 MG (100 UNITS) capsule Take 100 Units by mouth daily.       Contact information for follow-up providers    Schedule an appointment as soon as possible for a visit  with Little Ishikawa, MD.   Specialties: Cardiology, Radiology Contact information: [address] [place]  [address] [phone]        Tacy Learn, FNP. Schedule an appointment as soon as possible for a visit in 1 week(s).   Specialty: Family Medicine Contact information: [address] [place] [address] [phone]            Contact information for after-discharge care    Destination    [facility] .   Service: Skilled Nursing Contact information: [address] [place] [address] [phone]                 Allergies  Allergen Reactions  . Azithromycin Swelling    Consultations:  None.    Procedures/Studies: CT Angio Chest PE W/Cm &/Or Wo Cm  Addendum Date: 06/07/2020   ADDENDUM REPORT: 06/07/2020 00:35 ADDENDUM: These results were called by telephone at the time of interpretation on 06/07/2020 at 12:35 am to [facility], who verbally acknowledged these results. Electronically Signed   By: Elgie Collard M.D.   On: 06/07/2020 00:35   Result Date: 06/07/2020 CLINICAL DATA:  86 year old female with positive D-dimer. Concern for pulmonary embolism. EXAM: CT ANGIOGRAPHY CHEST WITH CONTRAST TECHNIQUE: Multidetector CT imaging of the chest was performed using the standard protocol during bolus administration of intravenous contrast. Multiplanar CT image reconstructions and MIPs were obtained to evaluate the vascular anatomy. CONTRAST:  75mL OMNIPAQUE IOHEXOL 350 MG/ML SOLN COMPARISON:  Chest radiograph dated 06/06/2020 and CT dated 11/22/2019. FINDINGS: Cardiovascular: There is no cardiomegaly or pericardial effusion. There is coronary vascular calcification and calcification of the mitral annulus. Moderate atherosclerotic calcification of the thoracic aorta. The aorta is tortuous. There are bilateral pulmonary artery emboli involving the right lower lobe segmental branch as well as lobar branch point in the left upper lobe which appear new since the prior CT. There is no CT evidence of right heart straining.  Mediastinum/Nodes: No hilar or mediastinal adenopathy. The esophagus is grossly unremarkable. No mediastinal fluid collection. Lungs/Pleura: There is diffuse hazy airspace opacity which may represent atelectasis or areas of air trapping suggestive of underlying small airways versus small vessel disease. There is a 6 mm right lower lobe subpleural nodule similar to prior CT. Additional 5 mm nodule in the right lower lobe appears similar. There is a 4 mm nodule in the right upper lobe (59/6) similar to prior CT. No lobar consolidation or pneumothorax. The central airways are patent. Upper Abdomen: No acute abnormality. Musculoskeletal: Osteopenia with degenerative changes of the spine. T10 compression fracture as seen previously. No acute osseous pathology. Review of the MIP images confirms the above findings. IMPRESSION: 1. Bilateral pulmonary artery emboli. No CT evidence of right heart straining. 2. Several nodules in the right lung measuring up to 6 mm. Non-contrast chest CT at 3-6 months is recommended. If the nodules are stable at time of repeat CT, then future CT at 18-24 months (from today's scan) is considered optional for low-risk patients, but is recommended for high-risk patients. This recommendation follows the consensus  statement: Guidelines for Management of Incidental Pulmonary Nodules Detected on CT Images: From the Fleischner Society 2017; Radiology 2017; 284:228-243. 3. Aortic Atherosclerosis (ICD10-I70.0). Electronically Signed: By: Elgie CollardArash  Radparvar M.D. On: 06/07/2020 00:13   DG Chest Port 1 View  Result Date: 06/06/2020 CLINICAL DATA:  Cough. EXAM: PORTABLE CHEST 1 VIEW COMPARISON:  11/21/2019 chest radiograph and prior studies FINDINGS: Cardiomegaly and mild pulmonary vascular congestion noted. Increased LEFT LOWER lung opacity may represent airspace disease or atelectasis. Question trace bilateral pleural effusions. No pneumothorax or acute bony abnormality. IMPRESSION: 1. LEFT LOWER lung  opacity which may represent atelectasis versus airspace disease/pneumonia. 2. Cardiomegaly with mild pulmonary vascular congestion and question trace bilateral pleural effusions. Electronically Signed   By: Harmon PierJeffrey  Hu M.D.   On: 06/06/2020 20:27   ECHOCARDIOGRAM COMPLETE  Result Date: 06/07/2020    ECHOCARDIOGRAM REPORT   Patient Name:   Catherine Hess Date of Exam: 06/07/2020 Medical Rec #:  [id number]       Height:       63.0 in Accession #:    [id number]      Weight:       145.0 lb Date of Birth:  07/11/1925        BSA:          1.687 m Patient Age:    89 years        BP:           127/71 mmHg Patient Gender: F               HR:           77 bpm. Exam Location:  Inpatient Procedure: 2D Echo, Cardiac Doppler and Color Doppler Indications:    I26.02 Pulmonary embolus  History:        Patient has no prior history of Echocardiogram examinations.                 Risk Factors:Hypertension. Hypothyroidism.  Sonographer:    Elmarie Shileyiffany Dance Referring Phys: [id number] ARSHAD N KAKRAKANDY IMPRESSIONS  1. Left ventricular ejection fraction, by estimation, is 65 to 70%. The left ventricle has normal function. The left ventricle has no regional wall motion abnormalities. There is moderate left ventricular hypertrophy. Left ventricular diastolic parameters are consistent with Grade I diastolic dysfunction (impaired relaxation). Elevated left ventricular end-diastolic pressure.  2. Right ventricular systolic function is normal. The right ventricular size is normal. There is normal pulmonary artery systolic pressure. The estimated right ventricular systolic pressure is 19.3 mmHg.  3. The mitral valve is abnormal. Trivial mitral valve regurgitation.  4. The aortic valve is tricuspid. Aortic valve regurgitation is mild. Mild aortic valve sclerosis is present, with no evidence of aortic valve stenosis.  5. The inferior vena cava is normal in size with greater than 50% respiratory variability, suggesting right atrial pressure of 3 mmHg.  Conclusion(s)/Recommendation(s): Findings consistent with no evidence of right heart strain. FINDINGS  Left Ventricle: Left ventricular ejection fraction, by estimation, is 65 to 70%. The left ventricle has normal function. The left ventricle has no regional wall motion abnormalities. The left ventricular internal cavity size was normal in size. There is  moderate left ventricular hypertrophy. Left ventricular diastolic parameters are consistent with Grade I diastolic dysfunction (impaired relaxation). Elevated left ventricular end-diastolic pressure. Right Ventricle: The right ventricular size is normal. No increase in right ventricular wall thickness. Right ventricular systolic function is normal. There is normal pulmonary artery systolic pressure. The tricuspid regurgitant velocity is 2.02 m/s, and  with an assumed right atrial pressure of 3 mmHg, the estimated right ventricular systolic pressure is 19.3 mmHg. Left Atrium: Left atrial size was normal in size. Right Atrium: Right atrial size was normal in size. Pericardium: There is no evidence of pericardial effusion. Mitral Valve: The mitral valve is abnormal. Mild mitral annular calcification. Trivial mitral valve regurgitation. Tricuspid Valve: The tricuspid valve is grossly normal. Tricuspid valve regurgitation is trivial. Aortic Valve: The aortic valve is tricuspid. Aortic valve regurgitation is mild. Aortic regurgitation PHT measures 490 msec. Mild aortic valve sclerosis is present, with no evidence of aortic valve stenosis. Pulmonic Valve: The pulmonic valve was normal in structure. Pulmonic valve regurgitation is not visualized. Aorta: The aortic root and ascending aorta are structurally normal, with no evidence of dilitation. Venous: The inferior vena cava is normal in size with greater than 50% respiratory variability, suggesting right atrial pressure of 3 mmHg. IAS/Shunts: No atrial level shunt detected by color flow Doppler.  LEFT VENTRICLE PLAX 2D  LVIDd:         3.50 cm  Diastology LVIDs:         2.14 cm  LV e' lateral:   3.92 cm/s LV PW:         1.40 cm  LV E/e' lateral: 18.1 LV IVS:        1.21 cm  LV e' medial:    3.15 cm/s LVOT diam:     1.80 cm  LV E/e' medial:  22.5 LV SV:         66 LV SV Index:   39 LVOT Area:     2.54 cm  RIGHT VENTRICLE             IVC RV Basal diam:  2.28 cm     IVC diam: 1.54 cm RV S prime:     11.50 cm/s TAPSE (M-mode): 1.6 cm LEFT ATRIUM             Index       RIGHT ATRIUM           Index LA diam:        2.70 cm 1.60 cm/m  RA Area:     10.30 cm LA Vol (A2C):   33.5 ml 19.86 ml/m RA Volume:   21.40 ml  12.69 ml/m LA Vol (A4C):   19.6 ml 11.62 ml/m LA Biplane Vol: 27.0 ml 16.01 ml/m  AORTIC VALVE LVOT Vmax:   152.00 cm/s LVOT Vmean:  90.400 cm/s LVOT VTI:    0.260 m AI PHT:      490 msec  AORTA Ao Root diam: 3.40 cm Ao Asc diam:  3.40 cm MITRAL VALVE                TRICUSPID VALVE MV Area (PHT): 1.86 cm     TV Peak grad:   17.3 mmHg MV Decel Time: 408 msec     TV Vmax:        2.08 m/s MV E velocity: 71.00 cm/s   TR Peak grad:   16.3 mmHg MV A velocity: 133.00 cm/s  TR Vmax:        202.00 cm/s MV E/A ratio:  0.53                             SHUNTS  Systemic VTI:  0.26 m                             Systemic Diam: 1.80 cm Zoila Shutter MD Electronically signed by Zoila Shutter MD Signature Date/Time: 06/07/2020/12:47:28 PM    Final    VAS [place] LOWER EXTREMITY VENOUS (DVT) (ONLY [facility] & [facility])  Result Date: 06/07/2020  Lower Venous DVTStudy Indications: Swelling, and History of LLE DVT on 11/22/19.  Performing Technologist: Marilynne Halsted RDMS, RVT  Examination Guidelines: A complete evaluation includes B-mode imaging, spectral Doppler, color Doppler, and power Doppler as needed of all accessible portions of each vessel. Bilateral testing is considered an integral part of a complete examination. Limited examinations for reoccurring indications may be performed as noted. The reflux portion of the exam is  performed with the patient in reverse Trendelenburg.  +---------+---------------+---------+-----------+----------+--------------+ RIGHT    CompressibilityPhasicitySpontaneityPropertiesThrombus Aging +---------+---------------+---------+-----------+----------+--------------+ CFV      Full           Yes      Yes                                 +---------+---------------+---------+-----------+----------+--------------+ SFJ      Full                                                        +---------+---------------+---------+-----------+----------+--------------+ FV Prox  Full                                                        +---------+---------------+---------+-----------+----------+--------------+ FV Mid   Partial                                      Chronic        +---------+---------------+---------+-----------+----------+--------------+ FV DistalPartial        Yes      Yes                  Chronic        +---------+---------------+---------+-----------+----------+--------------+ PFV      Full                                                        +---------+---------------+---------+-----------+----------+--------------+ POP      None           No       No                   Chronic        +---------+---------------+---------+-----------+----------+--------------+ PTV      Partial                                      Chronic        +---------+---------------+---------+-----------+----------+--------------+  PERO     Partial                                      Chronic        +---------+---------------+---------+-----------+----------+--------------+   +---------+---------------+---------+-----------+----------+--------------+ LEFT     CompressibilityPhasicitySpontaneityPropertiesThrombus Aging +---------+---------------+---------+-----------+----------+--------------+ CFV      Full           Yes      Yes                                  +---------+---------------+---------+-----------+----------+--------------+ SFJ      Full                                                        +---------+---------------+---------+-----------+----------+--------------+ FV Prox  Full                                                        +---------+---------------+---------+-----------+----------+--------------+ FV Mid   Full                                                        +---------+---------------+---------+-----------+----------+--------------+ FV DistalFull                                                        +---------+---------------+---------+-----------+----------+--------------+ PFV      Full           Yes      Yes                                 +---------+---------------+---------+-----------+----------+--------------+ POP      Full                                                        +---------+---------------+---------+-----------+----------+--------------+ PTV      Partial                                      Chronic        +---------+---------------+---------+-----------+----------+--------------+ PERO     Partial                                      Chronic        +---------+---------------+---------+-----------+----------+--------------+     Summary: RIGHT: - Findings consistent with chronic deep  vein thrombosis involving the right femoral vein, right popliteal vein, right peroneal veins, and right posterior tibial veins. - No cystic structure found in the popliteal fossa.  LEFT: - Findings consistent with chronic deep vein thrombosis involving the left posterior tibial veins, and left peroneal veins. - Findings appear improved from previous examination. - A cystic structure is found in the popliteal fossa. - Baker's cyst 3.8 x 3.5 x 1.2 cm.  *See table(s) above for measurements and observations. Electronically signed by Fabienne Bruns MD on 06/07/2020 at 5:14:02 PM.    Final         Subjective: No new complaints.   Discharge Exam: Vitals:   06/09/20 2119 06/10/20 0750  BP: (!) 144/65 (!) 154/60  Pulse:  76  Resp:  20  Temp: 98.3 F (36.8 C) 98.9 F (37.2 C)  SpO2: 95% 94%   Vitals:   06/09/20 1648 06/09/20 2119 06/10/20 0500 06/10/20 0750  BP: (!) 151/82 (!) 144/65  (!) 154/60  Pulse: 76   76  Resp: 16   20  Temp: 98.3 F (36.8 C) 98.3 F (36.8 C)  98.9 F (37.2 C)  TempSrc:  Oral    SpO2: 99% 95%  94%  Weight:   66.7 kg   Height:        General: Pt is alert, awake, not in acute distress Cardiovascular: RRR, S1/S2 +, no rubs, no gallops Respiratory: CTA bilaterally, no wheezing, no rhonchi Abdominal: Soft, NT, ND, bowel sounds + Extremities: no edema, no cyanosis    The results of significant diagnostics from this hospitalization (including imaging, microbiology, ancillary and laboratory) are listed below for reference.     Microbiology: Recent Results (from the past 240 hour(s))  SARS Coronavirus 2 by RT PCR (hospital order, performed in [facility] hospital lab) Nasopharyngeal Nasopharyngeal Swab     Status: None   Collection Time: 06/06/20  7:58 PM   Specimen: Nasopharyngeal Swab  Result Value Ref Range Status   SARS Coronavirus 2 NEGATIVE NEGATIVE Final    Comment: (NOTE) SARS-CoV-2 target nucleic acids are NOT DETECTED.  The SARS-CoV-2 RNA is generally detectable in upper and lower respiratory specimens during the acute phase of infection. The lowest concentration of SARS-CoV-2 viral copies this assay can detect is 250 copies / mL. A negative result does not preclude SARS-CoV-2 infection and should not be used as the sole basis for treatment or other patient management decisions.  A negative result may occur with improper specimen collection / handling, submission of specimen other than nasopharyngeal swab, presence of viral mutation(s) within the areas targeted by this assay, and inadequate number of viral copies (<250  copies / mL). A negative result must be combined with clinical observations, patient history, and epidemiological information.  Fact Sheet for Patients:   BoilerBrush.com.cy  Fact Sheet for Healthcare Providers: https://pope.com/  This test is not yet approved or  cleared by the [place] FDA and has been authorized for detection and/or diagnosis of SARS-CoV-2 by FDA under an Emergency Use Authorization (EUA).  This EUA will remain in effect (meaning this test can be used) for the duration of the COVID-19 declaration under Section 564(b)(1) of the Act, 21 U.S.C. section 360bbb-3(b)(1), unless the authorization is terminated or revoked sooner.  Performed at [facility], [address], [place], [place] [address]   SARS Coronavirus 2 by RT PCR (hospital order, performed in [facility] hospital lab) Nasopharyngeal Nasopharyngeal Swab     Status: None   Collection Time:  06/10/20  8:18 AM   Specimen: Nasopharyngeal Swab  Result Value Ref Range Status   SARS Coronavirus 2 NEGATIVE NEGATIVE Final    Comment: (NOTE) SARS-CoV-2 target nucleic acids are NOT DETECTED.  The SARS-CoV-2 RNA is generally detectable in upper and lower respiratory specimens during the acute phase of infection. The lowest concentration of SARS-CoV-2 viral copies this assay can detect is 250 copies / mL. A negative result does not preclude SARS-CoV-2 infection and should not be used as the sole basis for treatment or other patient management decisions.  A negative result may occur with improper specimen collection / handling, submission of specimen other than nasopharyngeal swab, presence of viral mutation(s) within the areas targeted by this assay, and inadequate number of viral copies (<250 copies / mL). A negative result must be combined with clinical observations, patient history, and epidemiological information.  Fact Sheet for Patients:    BoilerBrush.com.cy  Fact Sheet for Healthcare Providers: https://pope.com/  This test is not yet approved or  cleared by the [place] FDA and has been authorized for detection and/or diagnosis of SARS-CoV-2 by FDA under an Emergency Use Authorization (EUA).  This EUA will remain in effect (meaning this test can be used) for the duration of the COVID-19 declaration under Section 564(b)(1) of the Act, 21 U.S.C. section 360bbb-3(b)(1), unless the authorization is terminated or revoked sooner.  Performed at [facility], [address] [place], [place] [address]      Labs: BNP (last 3 results) Recent Labs    06/06/20 1229  BNP 261.2*   Basic Metabolic Panel: Recent Labs  Lab 06/06/20 1229 06/07/20 0126  NA 137 138  K 4.5 3.7  CL 103 102  CO2 24 27  GLUCOSE 117* 124*  BUN 12 12  CREATININE 0.79 0.80  CALCIUM 9.9 9.3   Liver Function Tests: No results for input(s): AST, ALT, ALKPHOS, BILITOT, PROT, ALBUMIN in the last 168 hours. No results for input(s): LIPASE, AMYLASE in the last 168 hours. No results for input(s): AMMONIA in the last 168 hours. CBC: Recent Labs  Lab 06/06/20 1229 06/07/20 0326 06/08/20 0439  WBC 8.8 7.7 7.0  HGB 12.7 13.5 12.2  HCT 40.1 41.9 37.3  MCV 100.8* 100.0 97.6  PLT 202 174 192   Cardiac Enzymes: No results for input(s): CKTOTAL, CKMB, CKMBINDEX, TROPONINI in the last 168 hours. BNP: Invalid input(s): POCBNP CBG: No results for input(s): GLUCAP in the last 168 hours. D-Dimer No results for input(s): DDIMER in the last 72 hours. Hgb A1c No results for input(s): HGBA1C in the last 72 hours. Lipid Profile No results for input(s): CHOL, HDL, LDLCALC, TRIG, CHOLHDL, LDLDIRECT in the last 72 hours. Thyroid function studies No results for input(s): TSH, T4TOTAL, T3FREE, THYROIDAB in the last 72 hours.  Invalid input(s): FREET3 Anemia work up No results for input(s):  VITAMINB12, FOLATE, FERRITIN, TIBC, IRON, RETICCTPCT in the last 72 hours. Urinalysis    Component Value Date/Time   COLORURINE YELLOW 06/06/2020 1227   APPEARANCEUR CLEAR 06/06/2020 1227   LABSPEC 1.010 06/06/2020 1227   PHURINE 7.0 06/06/2020 1227   GLUCOSEU NEGATIVE 06/06/2020 1227   HGBUR NEGATIVE 06/06/2020 1227   BILIRUBINUR NEGATIVE 06/06/2020 1227   KETONESUR NEGATIVE 06/06/2020 1227   PROTEINUR NEGATIVE 06/06/2020 1227   NITRITE NEGATIVE 06/06/2020 1227   LEUKOCYTESUR NEGATIVE 06/06/2020 1227   Sepsis Labs Invalid input(s): PROCALCITONIN,  WBC,  LACTICIDVEN Microbiology Recent Results (from the past 240 hour(s))  SARS Coronavirus 2 by RT  PCR (hospital order, performed in [facility] hospital lab) Nasopharyngeal Nasopharyngeal Swab     Status: None   Collection Time: 06/06/20  7:58 PM   Specimen: Nasopharyngeal Swab  Result Value Ref Range Status   SARS Coronavirus 2 NEGATIVE NEGATIVE Final    Comment: (NOTE) SARS-CoV-2 target nucleic acids are NOT DETECTED.  The SARS-CoV-2 RNA is generally detectable in upper and lower respiratory specimens during the acute phase of infection. The lowest concentration of SARS-CoV-2 viral copies this assay can detect is 250 copies / mL. A negative result does not preclude SARS-CoV-2 infection and should not be used as the sole basis for treatment or other patient management decisions.  A negative result may occur with improper specimen collection / handling, submission of specimen other than nasopharyngeal swab, presence of viral mutation(s) within the areas targeted by this assay, and inadequate number of viral copies (<250 copies / mL). A negative result must be combined with clinical observations, patient history, and epidemiological information.  Fact Sheet for Patients:   BoilerBrush.com.cy  Fact Sheet for Healthcare Providers: https://pope.com/  This test is not yet approved or   cleared by the [place] FDA and has been authorized for detection and/or diagnosis of SARS-CoV-2 by FDA under an Emergency Use Authorization (EUA).  This EUA will remain in effect (meaning this test can be used) for the duration of the COVID-19 declaration under Section 564(b)(1) of the Act, 21 U.S.C. section 360bbb-3(b)(1), unless the authorization is terminated or revoked sooner.  Performed at [facility], [address], [place], [place] [address]   SARS Coronavirus 2 by RT PCR (hospital order, performed in [facility] hospital lab) Nasopharyngeal Nasopharyngeal Swab     Status: None   Collection Time: 06/10/20  8:18 AM   Specimen: Nasopharyngeal Swab  Result Value Ref Range Status   SARS Coronavirus 2 NEGATIVE NEGATIVE Final    Comment: (NOTE) SARS-CoV-2 target nucleic acids are NOT DETECTED.  The SARS-CoV-2 RNA is generally detectable in upper and lower respiratory specimens during the acute phase of infection. The lowest concentration of SARS-CoV-2 viral copies this assay can detect is 250 copies / mL. A negative result does not preclude SARS-CoV-2 infection and should not be used as the sole basis for treatment or other patient management decisions.  A negative result may occur with improper specimen collection / handling, submission of specimen other than nasopharyngeal swab, presence of viral mutation(s) within the areas targeted by this assay, and inadequate number of viral copies (<250 copies / mL). A negative result must be combined with clinical observations, patient history, and epidemiological information.  Fact Sheet for Patients:   BoilerBrush.com.cy  Fact Sheet for Healthcare Providers: https://pope.com/  This test is not yet approved or  cleared by the [place] FDA and has been authorized for detection and/or diagnosis of SARS-CoV-2 by FDA under an Emergency Use Authorization (EUA).  This EUA  will remain in effect (meaning this test can be used) for the duration of the COVID-19 declaration under Section 564(b)(1) of the Act, 21 U.S.C. section 360bbb-3(b)(1), unless the authorization is terminated or revoked sooner.  Performed at [facility], [address] [place], [place] [address]      Time coordinating discharge: 32 minutes.   SIGNED:   Kathlen Mody, MD  [facility] 06/10/2020, 12:53 PM

## 2020-06-10 NOTE — Progress Notes (Signed)
Report call to [facility] and rehab

## 2020-06-10 NOTE — TOC Transition Note (Signed)
Transition of Care [facility]) - CM/SW Discharge Note   Patient Details  Name: Catherine Hess MRN: [id number] Date of Birth: [birth year]  [facility] CM/SW Contact:  Lorri Frederick, LCSW Phone Number: 06/10/2020, 1:31 PM   Clinical Narrative:   Pt going to Room 24, south wing.  RN to call [phone], ask for southwing RN.    Final next level of care: Skilled Nursing Facility Barriers to Discharge: Barriers Resolved   Patient Goals and CMS Choice Patient states their goals for this hospitalization and ongoing recovery are:: get well, walk better CMS Medicare.gov Compare Post Acute Care list provided to:: Patient Choice offered to / list presented to : Patient  Discharge Placement              Patient chooses bed at: [facility] Patient to be transferred to facility by: [facility] Name of family member notified: Clydie Braun, daughter Patient and family notified of of transfer: 06/10/20  Discharge Plan and Services     Post Acute Care Choice: Skilled Nursing Facility                               Social Determinants of Health (SDOH) Interventions     Readmission Risk Interventions No flowsheet data found.

## 2020-06-10 NOTE — Progress Notes (Signed)
Physical Therapy Treatment Patient Details Name: Catherine Hess MRN: [id number] DOB: 03/13/1925 Today's Date: 06/10/2020    History of Present Illness Catherine Hess is a 86 y.o. female with history of hypertension, hypothyroidism admitted in February 2021 of this year for saddle embolus and right lower extremity DVT was on anticoagulation for 6 months and discontinued recently was found to be increasingly short of breath last few days particularly on exertion with some wheezing. Pt found to have pulmonary embolism    PT Comments    Patient was laying in her hospital bed yelling to [place] in the hallway that she needed to use the bathroom at the start of the session.  Assisted the patient to sit EOB, then performed stand-pivot transfer to [facility] w/ RW. She was very fearful of transferring, continued to pull on RW to stand, and impulsive to reach for [facility]. After using BSC, assisted patient with hygiene and transferred BSC to sit EOB. Ambulated 20' w/ RW in her hospital room. Continued to be fearful of falling while walking and needed reassurance that she was safe while working with PT. Transferred back to bed. She was left laying in bed with all needs within reach and bed alarm set. See below for assistance levels.     Follow Up Recommendations  SNF;Supervision/Assistance - 24 hour     Equipment Recommendations  Rolling walker with 5" wheels;Other (comment);3in1 (PT) (defer to next venue)    Recommendations for Other Services       Precautions / Restrictions Precautions Precautions: Fall Restrictions Weight Bearing Restrictions: No    Mobility  Bed Mobility Overal bed mobility: Needs Assistance Bed Mobility: Supine to Sit;Sit to Supine     Supine to sit: Min assist Sit to supine: Min guard   General bed mobility comments: needed physical assistance to raise trunk to upright position. provided cueing to move legs and hands for supine <> sit. needed to be scooted up in bed at  EOS.  Transfers Overall transfer level: Needs assistance Equipment used: Rolling walker (2 wheeled) Transfers: Sit to/from [employer] Sit to Stand: Mod assist Stand pivot transfers: Mod assist       General transfer comment: patient was very anxious of falls during transfers. insisted on pulling on RW instead of pushing up from bed. modAx1 to stand upright.  Ambulation/Gait Ambulation/Gait assistance: Min assist Gait Distance (Feet): 20 Feet Assistive device: Rolling walker (2 wheeled) Gait Pattern/deviations: Step-to pattern;Decreased stride length Gait velocity: slow   General Gait Details: short unsteady steps. fearful of falling, reliant on RW   Stairs             Wheelchair Mobility    Modified Rankin (Stroke Patients Only)       Balance Overall balance assessment: Needs assistance Sitting-balance support: Feet supported;Bilateral upper extremity supported Sitting balance-Leahy Scale: Fair Sitting balance - Comments: sat EOB without UE assist   Standing balance support: Bilateral upper extremity supported;During functional activity Standing balance-Leahy Scale: Poor Standing balance comment: relaint on B UE on RW.                            Cognition Arousal/Alertness: Awake/alert Behavior During Therapy: Anxious Overall Cognitive Status: History of cognitive impairments - at baseline                                 General Comments: pt with  cognitive deficits at baseline, reports she is 86 yo      Exercises      General Comments General comments (skin integrity, edema, etc.): patient was very pleasant to work with, just very fearful during transfers and gait.      Pertinent Vitals/Pain Pain Assessment: Faces Faces Pain Scale: Hurts a little bit Pain Location: B LEs, arthritis Pain Descriptors / Indicators: Discomfort;Grimacing Pain Intervention(s): Monitored during session;Limited activity within  patient's tolerance    Home Living                      Prior Function            PT Goals (current goals can now be found in the care plan section) Acute Rehab PT Goals Patient Stated Goal: I'd like to go home PT Goal Formulation: With patient Time For Goal Achievement: 06/22/20 Potential to Achieve Goals: Good Progress towards PT goals: Progressing toward goals    Frequency    Min 2X/week      PT Plan Current plan remains appropriate    Co-evaluation              AM-PAC PT "6 Clicks" Mobility   Outcome Measure  Help needed turning from your back to your side while in a flat bed without using bedrails?: A Little Help needed moving from lying on your back to sitting on the side of a flat bed without using bedrails?: A Little Help needed moving to and from a bed to a chair (including a wheelchair)?: A Lot Help needed standing up from a chair using your arms (e.g., wheelchair or bedside chair)?: A Little Help needed to walk in hospital room?: A Little Help needed climbing 3-5 steps with a railing? : A Lot 6 Click Score: 16    End of Session Equipment Utilized During Treatment: Gait belt Activity Tolerance: Patient tolerated treatment well Patient left: in bed;with call bell/phone within reach;with bed alarm set Nurse Communication: Mobility status PT Visit Diagnosis: Unsteadiness on feet (R26.81);Muscle weakness (generalized) (M62.81)     Time:  -     Charges:                        Elisha Ponder, SPT, ATC

## 2021-07-10 IMAGING — CT CT ANGIO CHEST
2 of 7 series · 18 of 46 positions shown · IV contrast (APPLIED)
Comparison: None.

CLINICAL DATA: Shortness of breath.

EXAM:
CT ANGIOGRAPHY CHEST WITH CONTRAST
TECHNIQUE: Multidetector CT imaging of the chest was performed using the
standard protocol during bolus administration of intravenous
contrast. Multiplanar CT image reconstructions and MIPs were
obtained to evaluate the vascular anatomy.
CONTRAST:  75mL OMNIPAQUE IOHEXOL 350 MG/ML SOLN

[Series 8: thins · axial · 0.60mm/px · z∈[-487,-240]mm · 15 of 399 slices shown]
[im 23/399  lung]
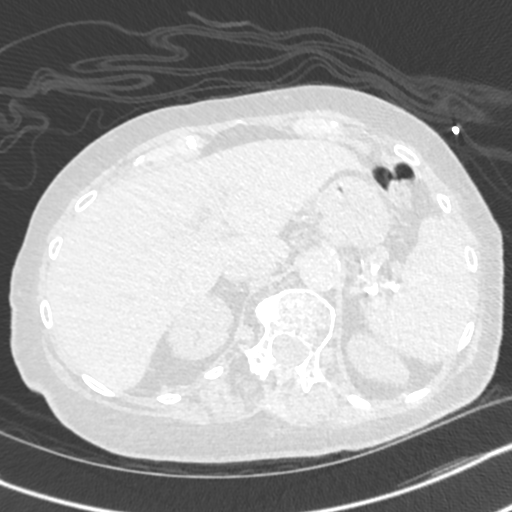
[im 45/399  soft-tissue]
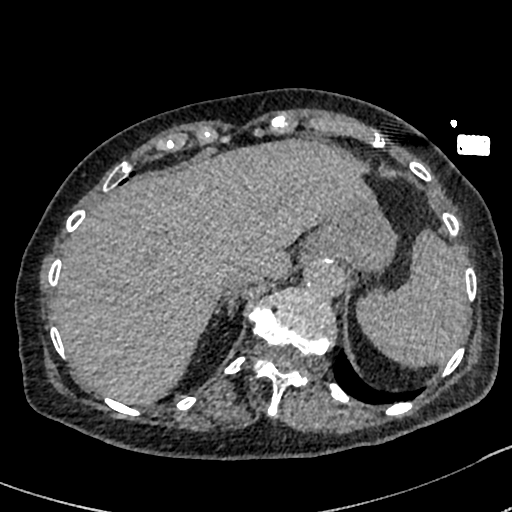
[im 67/399  lung]
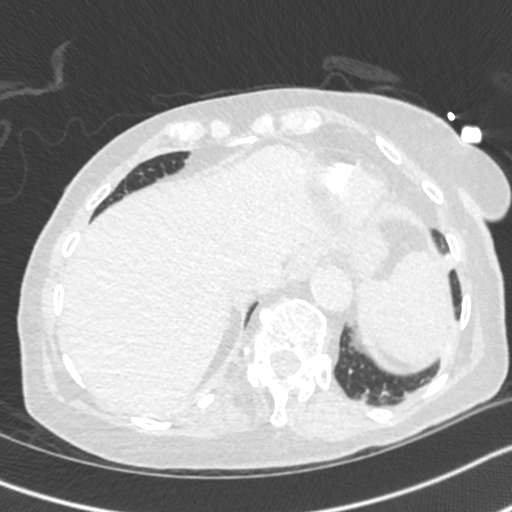
[im 89/399  soft-tissue]
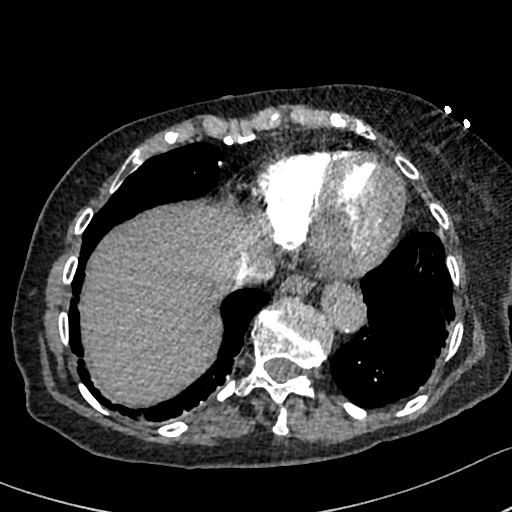
[im 133/399  lung]
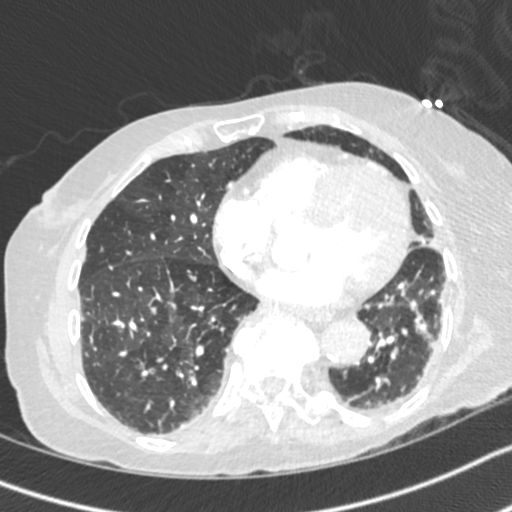
[im 155/399  soft-tissue]
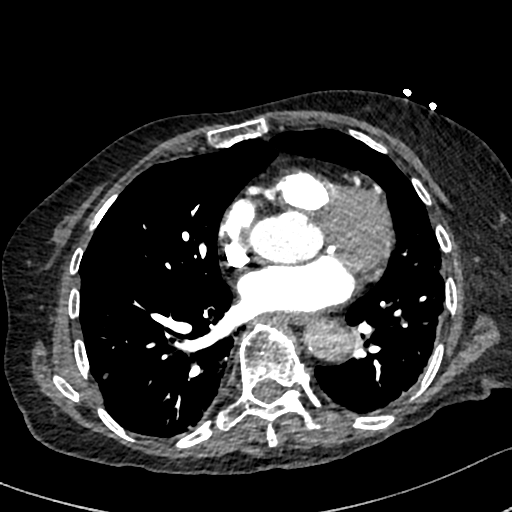
[im 177/399  lung]
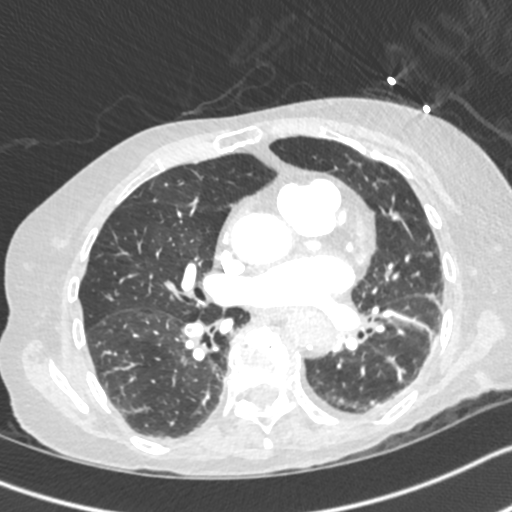
[im 200/399  soft-tissue]
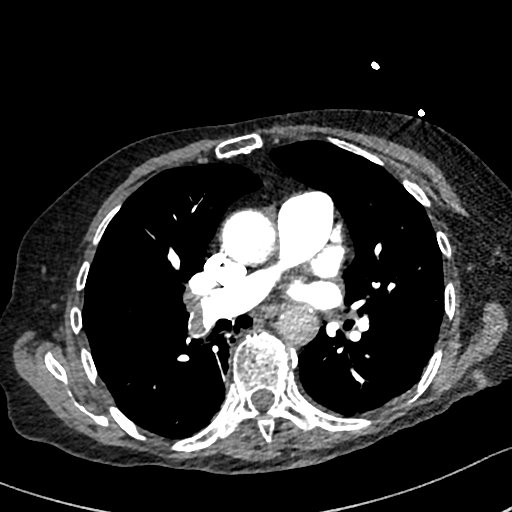
[im 222/399  lung]
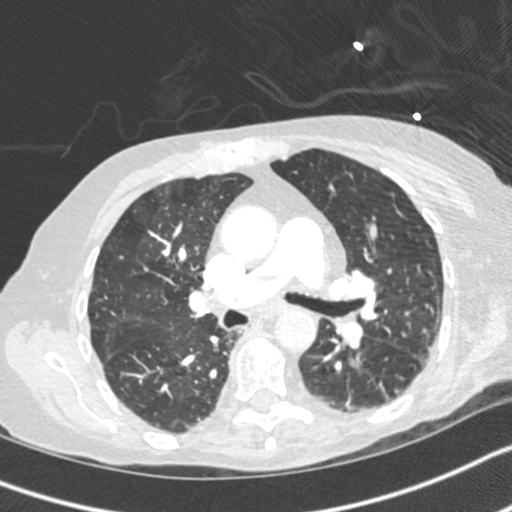
[im 244/399  soft-tissue]
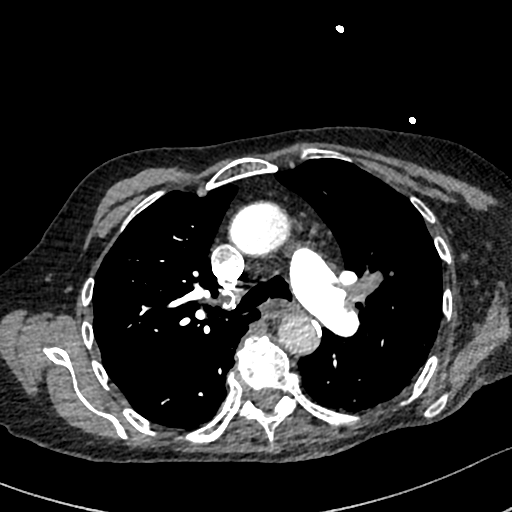
[im 266/399  lung]
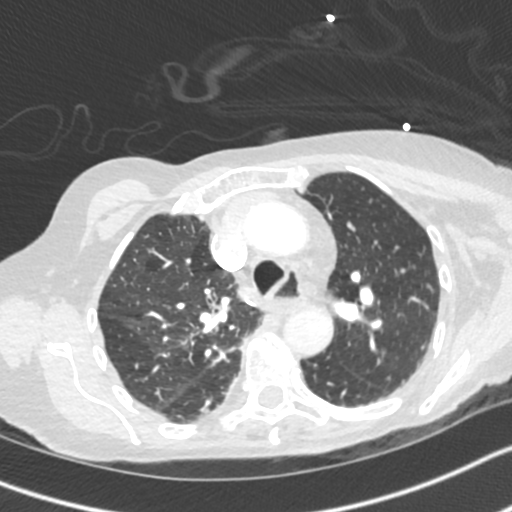
[im 310/399  soft-tissue]
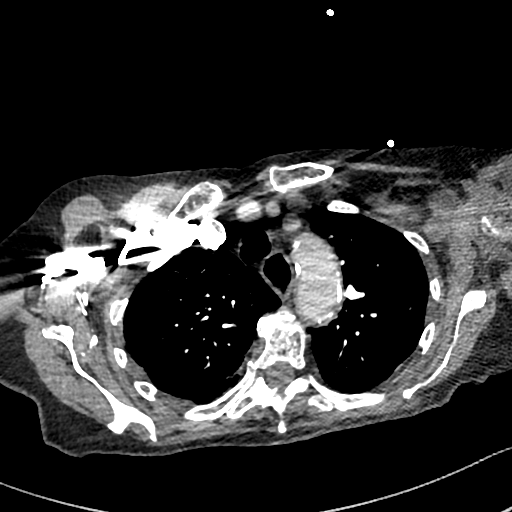
[im 332/399  lung]
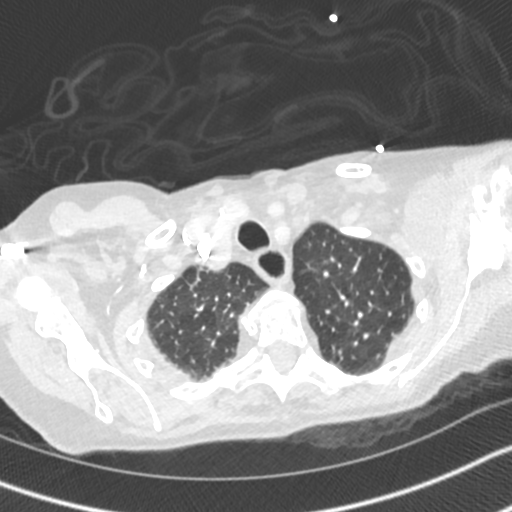
[im 354/399  soft-tissue]
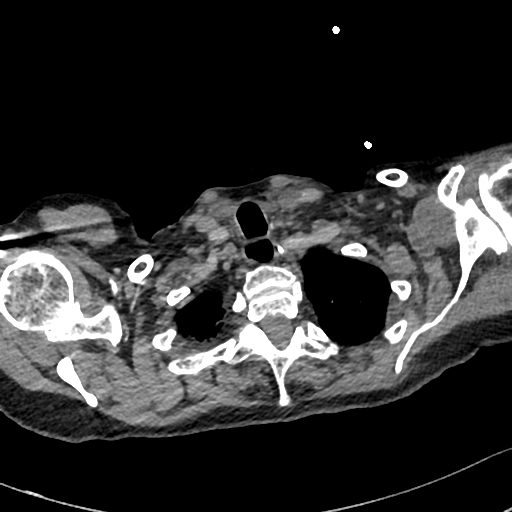
[im 376/399  lung]
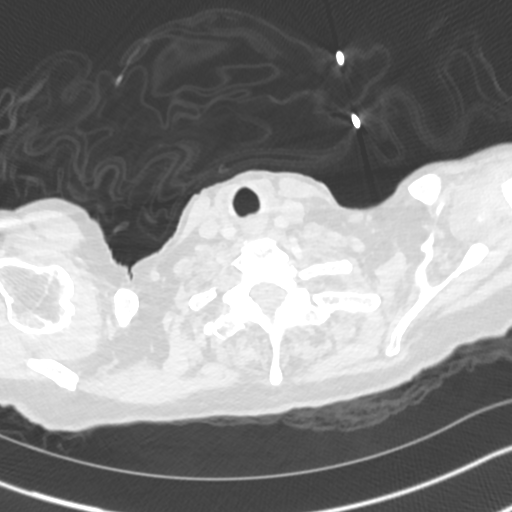

[Series 9: cor · coronal · 0.59mm/px · 3 of 116 slices shown]
[im 29/116  soft-tissue]
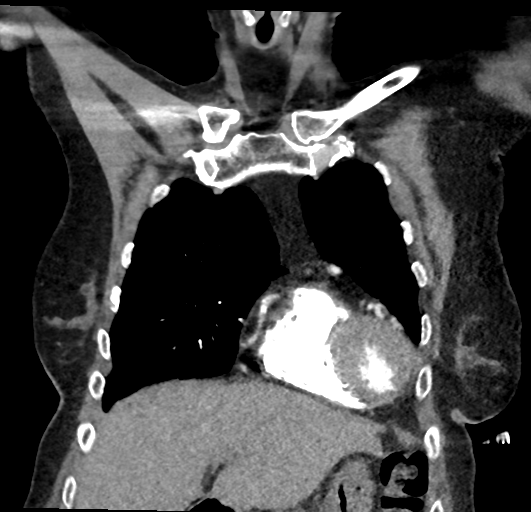
[im 58/116  soft-tissue]
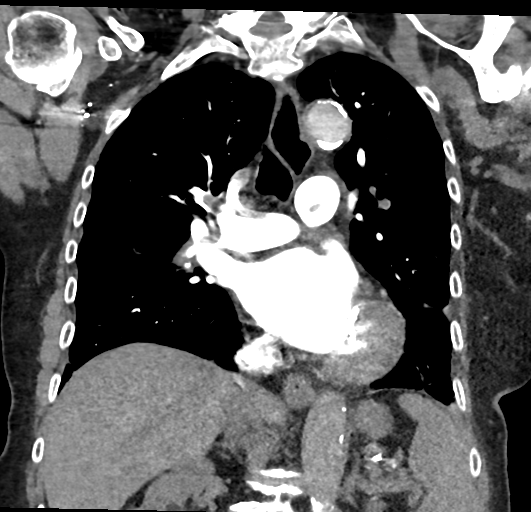
[im 87/116  soft-tissue]
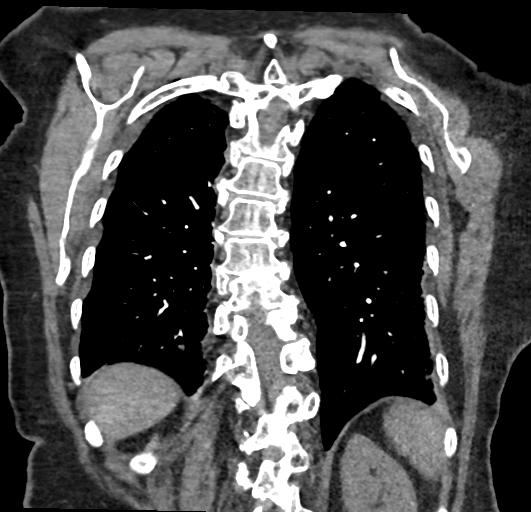

[18 of 46 positions shown; findings below may reference images not displayed]

FINDINGS: Cardiovascular: Large linear filling defect is seen at the
bifurcation of the main pulmonary artery which extends into the left
and right pulmonary arteries bilaterally consistent with pulmonary
embolus. RV/LV ratio measures 1.0 suggesting right heart strain.
Atherosclerosis of thoracic aorta is noted without aneurysm
formation. Normal cardiac size. No pericardial effusion.

Mediastinum/Nodes: No enlarged mediastinal, hilar, or axillary lymph
nodes. Thyroid gland, trachea, and esophagus demonstrate no
significant findings.

Lungs/Pleura: Lungs are clear. No pleural effusion or pneumothorax.

Upper Abdomen: No acute abnormality.

Musculoskeletal: No chest wall abnormality. No acute or significant
osseous findings.

Review of the MIP images confirms the above findings.
IMPRESSION: Large linear saddle pulmonary embolus is noted which extends into
both the right and left pulmonary arteries and their peripheral
branches. RV/LV ratio is 1.0 suggesting right heart strain. Positive
for acute PE with CT evidence of right heart strain (RV/LV Ratio =
1.0) consistent with at least submassive (intermediate risk) PE. The
presence of right heart strain has been associated with an increased
risk of morbidity and mortality. Please activate Code PE by paging
[phone]. Critical Value/emergent results were called by
telephone at the time of interpretation on 11/22/2019 at [DATE] to
provider THIMIO OPTIMAL , who verbally acknowledged these results.

Aortic Atherosclerosis ([id number]-7YO.O).

## 2021-11-16 DEATH — deceased

## 2022-01-23 IMAGING — CT CT ANGIO CHEST
2 of 6 series · 16 of 36 positions shown · IV contrast (omnipaque)
Comparison: Chest radiograph dated 06/06/2020 and CT dated
11/22/2019.
COMPARISON: Chest radiograph dated 06/06/2020 and CT dated
11/22/2019.

Addendum:
CLINICAL DATA: [AGE] female with positive D-dimer. Concern
for pulmonary embolism.

EXAM:
CT ANGIOGRAPHY CHEST WITH CONTRAST
TECHNIQUE: Multidetector CT imaging of the chest was performed using the
standard protocol during bolus administration of intravenous
contrast. Multiplanar CT image reconstructions and MIPs were
obtained to evaluate the vascular anatomy.
CONTRAST:  75mL OMNIPAQUE IOHEXOL 350 MG/ML SOLN

[Series 7: pe thins · axial · 0.78mm/px · z∈[+1088,+1316]mm · 15 of 363 slices shown]
[im 19/363  lung]
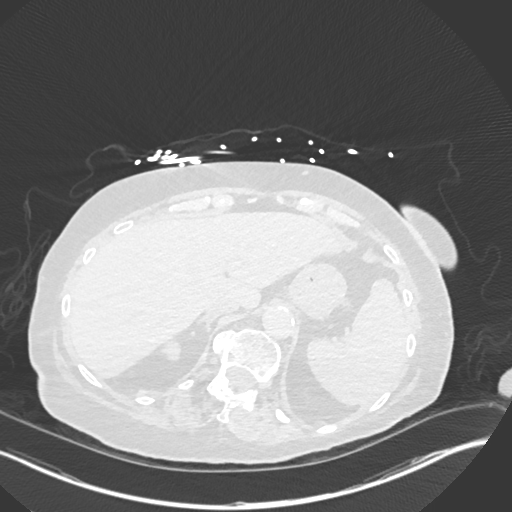
[im 37/363  mediastinal]
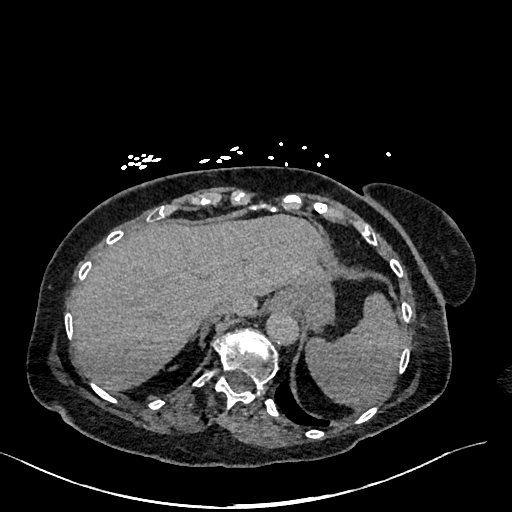
[im 73/363  lung]
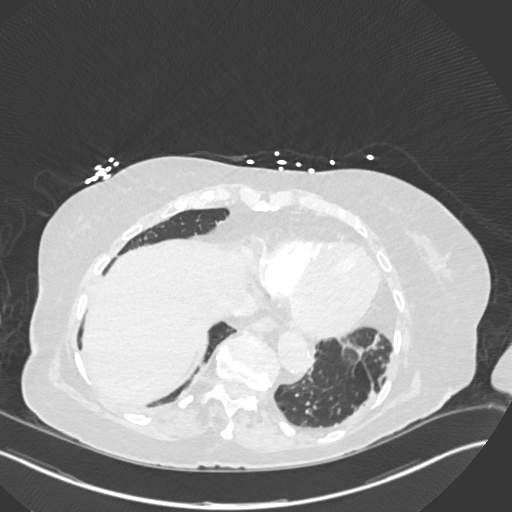
[im 91/363  mediastinal]
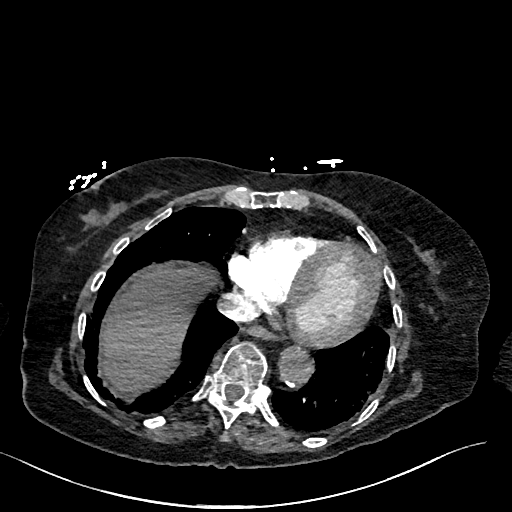
[im 109/363  lung]
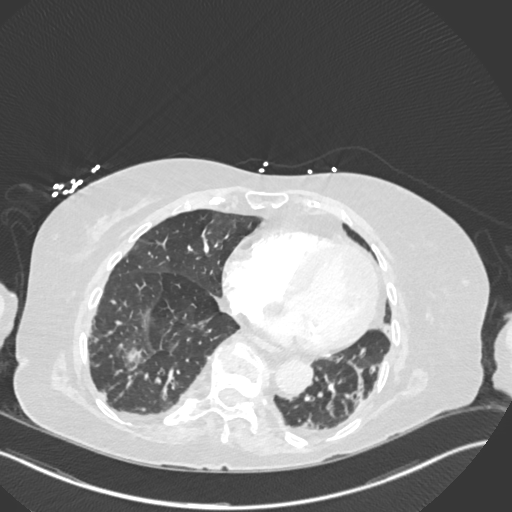
[im 127/363  mediastinal]
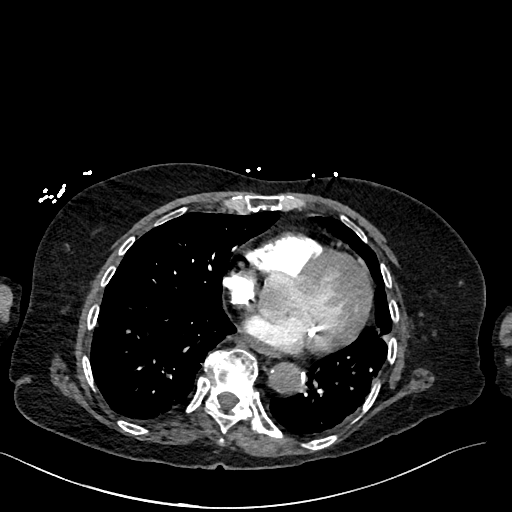
[im 163/363  lung]
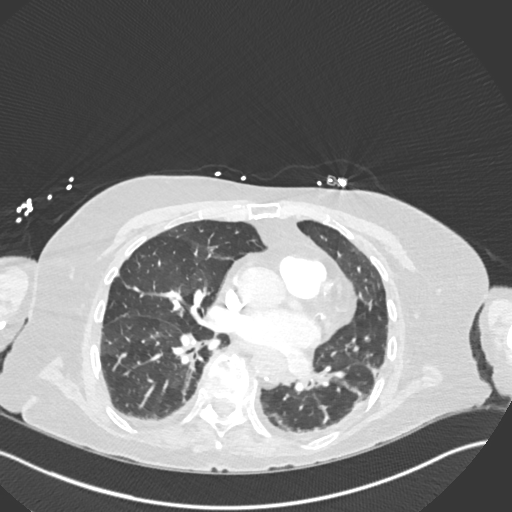
[im 182/363  mediastinal]
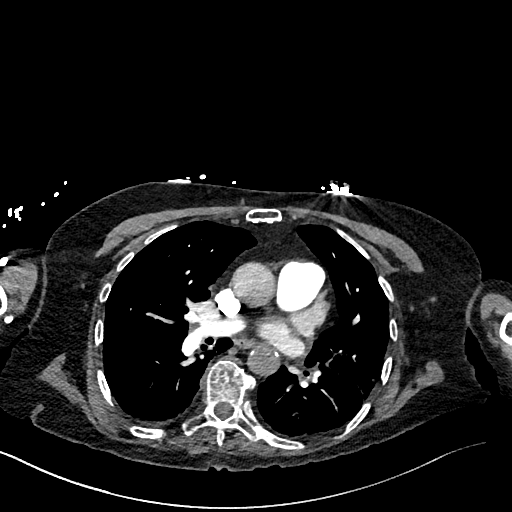
[im 200/363  lung]
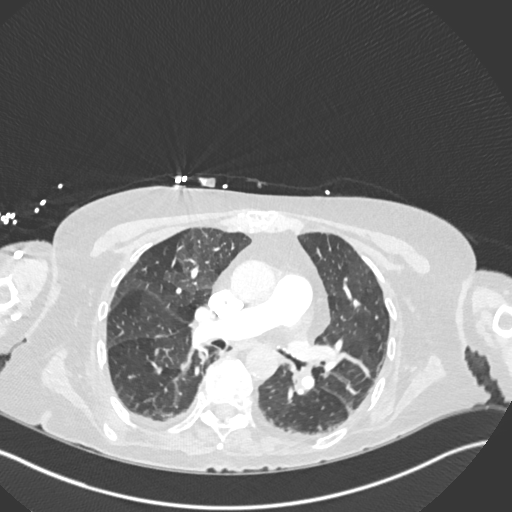
[im 236/363  mediastinal]
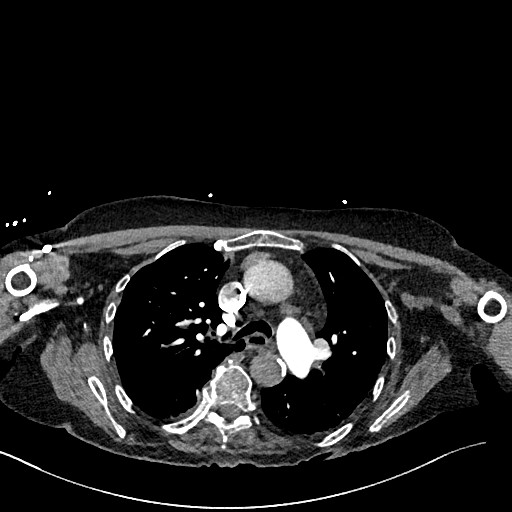
[im 254/363  lung]
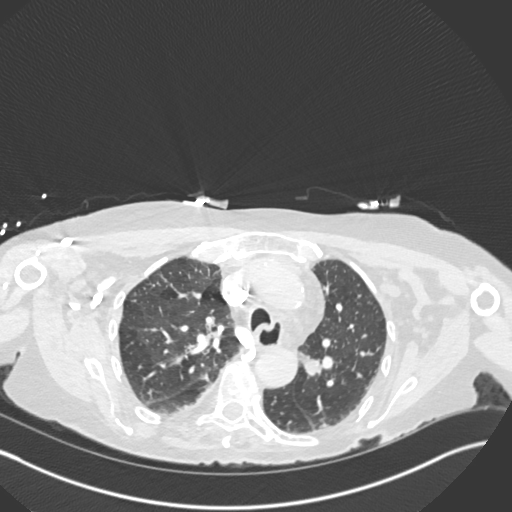
[im 272/363  mediastinal]
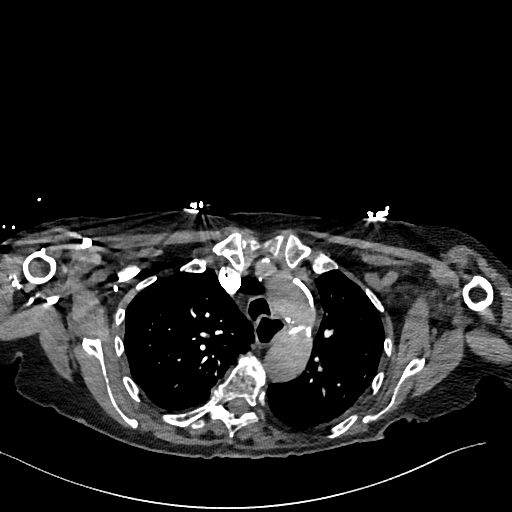
[im 290/363  lung]
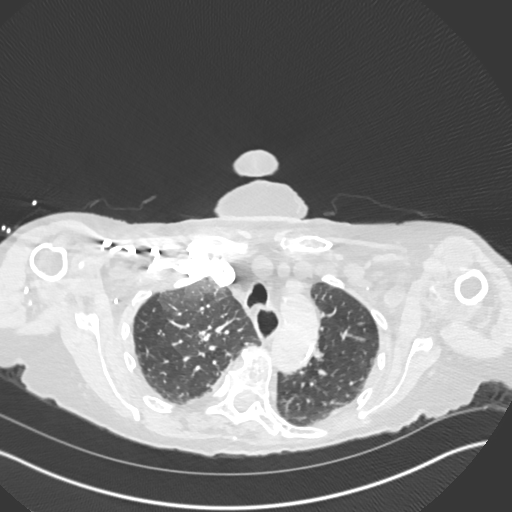
[im 326/363  mediastinal]
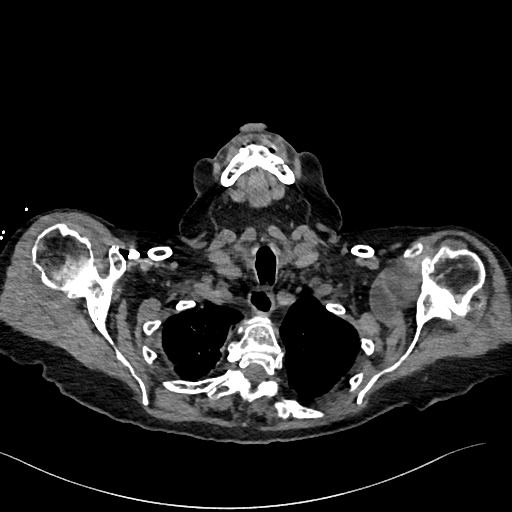
[im 344/363  lung]
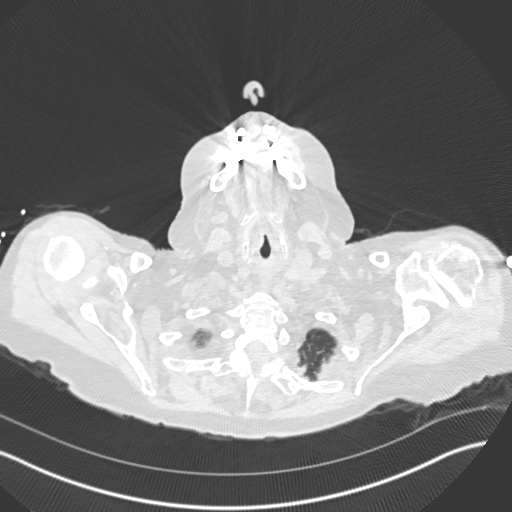

[Series 8: pe 2mm cor · coronal · 0.52mm/px · 1 of 119 slices shown]
[im 60/119  mediastinal]
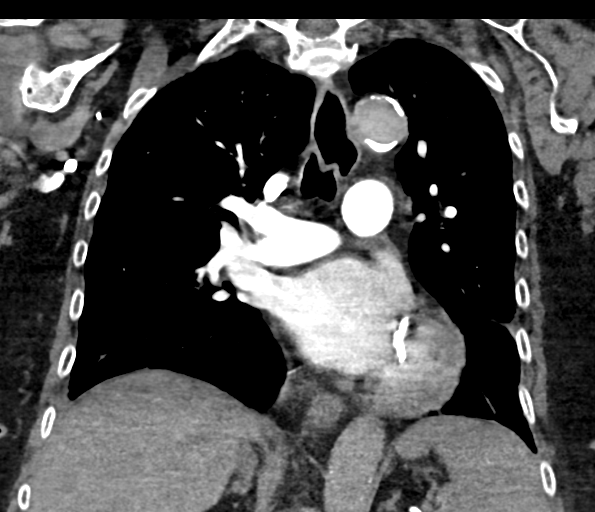

[16 of 36 positions shown; findings below may reference images not displayed]

FINDINGS: Cardiovascular: There is no cardiomegaly or pericardial effusion.
There is coronary vascular calcification and calcification of the
mitral annulus. Moderate atherosclerotic calcification of the
thoracic aorta. The aorta is tortuous. There are bilateral pulmonary
artery emboli involving the right lower lobe segmental branch as
well as lobar branch point in the left upper lobe which appear new
since the prior CT. There is no CT evidence of right heart
straining.

Mediastinum/Nodes: No hilar or mediastinal adenopathy. The esophagus
is grossly unremarkable. No mediastinal fluid collection.

Lungs/Pleura: There is diffuse hazy airspace opacity which may
represent atelectasis or areas of air trapping suggestive of
underlying small airways versus small vessel disease. There is a 6
mm right lower lobe subpleural nodule similar to prior CT.
Additional 5 mm nodule in the right lower lobe appears similar.
There is a 4 mm nodule in the right upper lobe (59/6) similar to
prior CT. No lobar consolidation or pneumothorax. The central
airways are patent.

Upper Abdomen: No acute abnormality.

Musculoskeletal: Osteopenia with degenerative changes of the spine.
T10 compression fracture as seen previously. No acute osseous
pathology.

Review of the MIP images confirms the above findings.
IMPRESSION: 1. Bilateral pulmonary artery emboli. No CT evidence of right heart
straining.
2. Several nodules in the right lung measuring up to 6 mm.
Non-contrast chest CT at 3-6 months is recommended. If the nodules
are stable at time of repeat CT, then future CT at 18-24 months
(from today's scan) is considered optional for low-risk patients,
but is recommended for high-risk patients. This recommendation
follows the consensus statement: Guidelines for Management of
Incidental Pulmonary Nodules Detected on CT Images: From the
3. Aortic Atherosclerosis (8NOKJ-0RX.X).

ADDENDUM:
These results were called by telephone at the time of interpretation
on 06/07/2020 at [DATE] to PA Dainty Cynth Anizoba, who verbally
acknowledged these results.

*** End of Addendum ***
FINDINGS: Cardiovascular: There is no cardiomegaly or pericardial effusion.
There is coronary vascular calcification and calcification of the
mitral annulus. Moderate atherosclerotic calcification of the
thoracic aorta. The aorta is tortuous. There are bilateral pulmonary
artery emboli involving the right lower lobe segmental branch as
well as lobar branch point in the left upper lobe which appear new
since the prior CT. There is no CT evidence of right heart
straining.

Mediastinum/Nodes: No hilar or mediastinal adenopathy. The esophagus
is grossly unremarkable. No mediastinal fluid collection.

Lungs/Pleura: There is diffuse hazy airspace opacity which may
represent atelectasis or areas of air trapping suggestive of
underlying small airways versus small vessel disease. There is a 6
mm right lower lobe subpleural nodule similar to prior CT.
Additional 5 mm nodule in the right lower lobe appears similar.
There is a 4 mm nodule in the right upper lobe (59/6) similar to
prior CT. No lobar consolidation or pneumothorax. The central
airways are patent.

Upper Abdomen: No acute abnormality.

Musculoskeletal: Osteopenia with degenerative changes of the spine.
T10 compression fracture as seen previously. No acute osseous
pathology.

Review of the MIP images confirms the above findings.
IMPRESSION: 1. Bilateral pulmonary artery emboli. No CT evidence of right heart
straining.
2. Several nodules in the right lung measuring up to 6 mm.
Non-contrast chest CT at 3-6 months is recommended. If the nodules
are stable at time of repeat CT, then future CT at 18-24 months
(from today's scan) is considered optional for low-risk patients,
but is recommended for high-risk patients. This recommendation
follows the consensus statement: Guidelines for Management of
Incidental Pulmonary Nodules Detected on CT Images: From the
3. Aortic Atherosclerosis (8NOKJ-0RX.X).
